# Patient Record
Sex: Female | Born: 1935 | Race: White | Hispanic: No | Smoking: Former smoker
Health system: Southern US, Community
[De-identification: ages and names within clinical notes are randomized; demographics above are authoritative.]

## PROBLEM LIST (undated history)

## (undated) DIAGNOSIS — C801 Malignant (primary) neoplasm, unspecified: Secondary | ICD-10-CM

## (undated) DIAGNOSIS — E079 Disorder of thyroid, unspecified: Secondary | ICD-10-CM

## (undated) HISTORY — PX: BREAST RECONSTRUCTION: SHX9

## (undated) HISTORY — PX: EYE SURGERY: SHX253

---

## 1998-02-23 ENCOUNTER — Ambulatory Visit (HOSPITAL_COMMUNITY): Admission: RE | Admit: 1998-02-23 | Discharge: 1998-02-23 | Payer: Self-pay | Admitting: Urology

## 1998-02-25 ENCOUNTER — Other Ambulatory Visit: Admission: RE | Admit: 1998-02-25 | Discharge: 1998-02-25 | Payer: Self-pay | Admitting: *Deleted

## 2009-08-11 ENCOUNTER — Emergency Department (HOSPITAL_COMMUNITY): Admission: EM | Admit: 2009-08-11 | Discharge: 2009-08-11 | Payer: Self-pay | Admitting: Emergency Medicine

## 2014-10-11 ENCOUNTER — Emergency Department (HOSPITAL_COMMUNITY): Payer: Medicare Other

## 2014-10-11 ENCOUNTER — Encounter (HOSPITAL_COMMUNITY): Payer: Self-pay | Admitting: *Deleted

## 2014-10-11 ENCOUNTER — Inpatient Hospital Stay (HOSPITAL_COMMUNITY)
Admission: EM | Admit: 2014-10-11 | Discharge: 2014-10-16 | DRG: 065 | Disposition: A | Discharge status: Skilled Nursing Facility | Payer: Medicare Other | Attending: Internal Medicine | Admitting: Internal Medicine

## 2014-10-11 DIAGNOSIS — G8194 Hemiplegia, unspecified affecting left nondominant side: Secondary | ICD-10-CM

## 2014-10-11 DIAGNOSIS — Z853 Personal history of malignant neoplasm of breast: Secondary | ICD-10-CM | POA: Diagnosis not present

## 2014-10-11 DIAGNOSIS — I63511 Cerebral infarction due to unspecified occlusion or stenosis of right middle cerebral artery: Secondary | ICD-10-CM

## 2014-10-11 DIAGNOSIS — Z66 Do not resuscitate: Secondary | ICD-10-CM | POA: Diagnosis present

## 2014-10-11 DIAGNOSIS — Z515 Encounter for palliative care: Secondary | ICD-10-CM | POA: Diagnosis not present

## 2014-10-11 DIAGNOSIS — Z87891 Personal history of nicotine dependence: Secondary | ICD-10-CM

## 2014-10-11 DIAGNOSIS — I6350 Cerebral infarction due to unspecified occlusion or stenosis of unspecified cerebral artery: Secondary | ICD-10-CM | POA: Diagnosis not present

## 2014-10-11 DIAGNOSIS — R4701 Aphasia: Secondary | ICD-10-CM | POA: Diagnosis present

## 2014-10-11 DIAGNOSIS — R4182 Altered mental status, unspecified: Secondary | ICD-10-CM | POA: Diagnosis not present

## 2014-10-11 DIAGNOSIS — G8190 Hemiplegia, unspecified affecting unspecified side: Secondary | ICD-10-CM | POA: Diagnosis not present

## 2014-10-11 DIAGNOSIS — R778 Other specified abnormalities of plasma proteins: Secondary | ICD-10-CM | POA: Diagnosis present

## 2014-10-11 DIAGNOSIS — R001 Bradycardia, unspecified: Secondary | ICD-10-CM | POA: Diagnosis present

## 2014-10-11 DIAGNOSIS — R7989 Other specified abnormal findings of blood chemistry: Secondary | ICD-10-CM | POA: Diagnosis not present

## 2014-10-11 DIAGNOSIS — I639 Cerebral infarction, unspecified: Secondary | ICD-10-CM | POA: Diagnosis not present

## 2014-10-11 DIAGNOSIS — I248 Other forms of acute ischemic heart disease: Secondary | ICD-10-CM | POA: Diagnosis present

## 2014-10-11 DIAGNOSIS — R1314 Dysphagia, pharyngoesophageal phase: Secondary | ICD-10-CM | POA: Diagnosis not present

## 2014-10-11 HISTORY — DX: Disorder of thyroid, unspecified: E07.9

## 2014-10-11 HISTORY — DX: Malignant (primary) neoplasm, unspecified: C80.1

## 2014-10-11 LAB — URINALYSIS, ROUTINE W REFLEX MICROSCOPIC
Bilirubin Urine: NEGATIVE
Glucose, UA: NEGATIVE mg/dL
Ketones, ur: 15 mg/dL — AB
Leukocytes, UA: NEGATIVE
Nitrite: NEGATIVE
Protein, ur: 30 mg/dL — AB
Specific Gravity, Urine: 1.025 (ref 1.005–1.030)
Urobilinogen, UA: 0.2 mg/dL (ref 0.0–1.0)
pH: 5.5 (ref 5.0–8.0)

## 2014-10-11 LAB — COMPREHENSIVE METABOLIC PANEL
ALT: 95 U/L — ABNORMAL HIGH (ref 14–54)
AST: 53 U/L — ABNORMAL HIGH (ref 15–41)
Albumin: 3.7 g/dL (ref 3.5–5.0)
Alkaline Phosphatase: 56 U/L (ref 38–126)
Anion gap: 12 (ref 5–15)
BUN: 43 mg/dL — ABNORMAL HIGH (ref 6–20)
CO2: 23 mmol/L (ref 22–32)
Calcium: 8.8 mg/dL — ABNORMAL LOW (ref 8.9–10.3)
Chloride: 98 mmol/L — ABNORMAL LOW (ref 101–111)
Creatinine, Ser: 0.98 mg/dL (ref 0.44–1.00)
GFR calc Af Amer: 57 mL/min — ABNORMAL LOW (ref >60)
GFR calc non Af Amer: 49 mL/min — ABNORMAL LOW (ref >60)
Glucose, Bld: 140 mg/dL — ABNORMAL HIGH (ref 65–99)
Potassium: 3.8 mmol/L (ref 3.5–5.1)
Sodium: 133 mmol/L — ABNORMAL LOW (ref 135–145)
Total Bilirubin: 1.5 mg/dL — ABNORMAL HIGH (ref 0.3–1.2)
Total Protein: 6.9 g/dL (ref 6.5–8.1)

## 2014-10-11 LAB — RAPID URINE DRUG SCREEN, HOSP PERFORMED
Amphetamines: NOT DETECTED
Amphetamines: NOT DETECTED
Barbiturates: NOT DETECTED
Barbiturates: NOT DETECTED
Benzodiazepines: NOT DETECTED
Benzodiazepines: NOT DETECTED
COCAINE: NOT DETECTED
Cocaine: NOT DETECTED
Opiates: NOT DETECTED
Opiates: NOT DETECTED
Tetrahydrocannabinol: NOT DETECTED
Tetrahydrocannabinol: NOT DETECTED

## 2014-10-11 LAB — GLUCOSE, CAPILLARY
GLUCOSE-CAPILLARY: 109 mg/dL — AB (ref 65–99)
GLUCOSE-CAPILLARY: 103 mg/dL — AB (ref 65–99)
Glucose-Capillary: 116 mg/dL — ABNORMAL HIGH (ref 65–99)
Glucose-Capillary: 94 mg/dL (ref 65–99)

## 2014-10-11 LAB — PROTIME-INR
INR: 1.27 (ref 0.00–1.49)
PROTHROMBIN TIME: 16 s — AB (ref 11.6–15.2)

## 2014-10-11 LAB — CBC
HEMATOCRIT: 37.5 % (ref 36.0–46.0)
Hemoglobin: 12.5 g/dL (ref 12.0–15.0)
MCH: 31.4 pg (ref 26.0–34.0)
MCHC: 33.3 g/dL (ref 30.0–36.0)
MCV: 94.2 fL (ref 78.0–100.0)
PLATELETS: 208 10*3/uL (ref 150–400)
RBC: 3.98 MIL/uL (ref 3.87–5.11)
RDW: 13.6 % (ref 11.5–15.5)
WBC: 11.1 10*3/uL — ABNORMAL HIGH (ref 4.0–10.5)

## 2014-10-11 LAB — DIFFERENTIAL
BASOS PCT: 0 % (ref 0–1)
Basophils Absolute: 0 10*3/uL (ref 0.0–0.1)
Eosinophils Absolute: 0 10*3/uL (ref 0.0–0.7)
Eosinophils Relative: 0 % (ref 0–5)
LYMPHS PCT: 3 % — AB (ref 12–46)
Lymphs Abs: 0.3 10*3/uL — ABNORMAL LOW (ref 0.7–4.0)
Monocytes Absolute: 0.9 10*3/uL (ref 0.1–1.0)
Monocytes Relative: 8 % (ref 3–12)
NEUTROS PCT: 89 % — AB (ref 43–77)
Neutro Abs: 9.9 10*3/uL — ABNORMAL HIGH (ref 1.7–7.7)

## 2014-10-11 LAB — CK: Total CK: 541 U/L — ABNORMAL HIGH (ref 38–234)

## 2014-10-11 LAB — URINE MICROSCOPIC-ADD ON

## 2014-10-11 LAB — I-STAT TROPONIN, ED: Troponin i, poc: 1.95 ng/mL (ref 0.00–0.08)

## 2014-10-11 LAB — APTT: APTT: 27 s (ref 24–37)

## 2014-10-11 MED ORDER — STROKE: EARLY STAGES OF RECOVERY BOOK
Freq: Once | Status: AC
  Administered 2014-10-11: 08:00:00
  Filled 2014-10-11: qty 1

## 2014-10-11 MED ORDER — CETYLPYRIDINIUM CHLORIDE 0.05 % MT LIQD
7.0000 mL | Freq: Two times a day (BID) | OROMUCOSAL | Status: DC
  Administered 2014-10-11 – 2014-10-15 (×8): 7 mL via OROMUCOSAL

## 2014-10-11 MED ORDER — SODIUM CHLORIDE 0.9 % IV SOLN
INTRAVENOUS | Status: AC
  Administered 2014-10-11 – 2014-10-12 (×2): via INTRAVENOUS

## 2014-10-11 MED ORDER — CHLORHEXIDINE GLUCONATE 0.12 % MT SOLN
15.0000 mL | Freq: Two times a day (BID) | OROMUCOSAL | Status: DC
  Administered 2014-10-11 – 2014-10-14 (×7): 15 mL via OROMUCOSAL
  Filled 2014-10-11 (×6): qty 15

## 2014-10-11 MED ORDER — ASPIRIN 300 MG RE SUPP
300.0000 mg | Freq: Every day | RECTAL | Status: DC
  Administered 2014-10-11 – 2014-10-15 (×5): 300 mg via RECTAL
  Filled 2014-10-11 (×7): qty 1

## 2014-10-11 MED ORDER — HEPARIN SODIUM (PORCINE) 5000 UNIT/ML IJ SOLN
5000.0000 [IU] | Freq: Three times a day (TID) | INTRAMUSCULAR | Status: DC
  Administered 2014-10-11 – 2014-10-16 (×13): 5000 [IU] via SUBCUTANEOUS
  Filled 2014-10-11 (×15): qty 1

## 2014-10-11 NOTE — H&P (Signed)
[facility] History and Physical  Tara Welch TKP:[id number] DOB: March 30, 1922    PCP:   No primary care provider on file.   Chief Complaint: altered mental status and left sided weakness.   HPI: Tara Welch is an 81 y.o. female lives at home with her son, brought to the ER via EMS as a code stroke.  His son noted she was "out of it" over 12 hours prior to arrival (Noon time on Sun May 30th, 2016).  He subsequently found her on the floor and summoned EMS.  In the ER, Code stroke was cancelled as her ictus was over 12 hours PTA.  She had an urgent CT of the head, which showed a large subacute CVA in the MCA territory.  Her troponin was also elevated to 1.95.  Her EKG showed accelerated junctional rhythm with no ST elevation.  She was able to converse, but was not able to move her left side.  She was not able to swallow.  Hospitalist was asked to admit her for acute/subacute severe CVA.  When I saw her, her son had left the hospital.   Rewiew of Systems: Unable.   Past Medical History  Diagnosis Date  . Cancer     right breast  . Thyroid disease     Past Surgical History  Procedure Laterality Date  . Breast reconstruction Right   . Eye surgery      Medications:  HOME MEDS: Prior to Admission medications   Not on File     Allergies:  No Known Allergies  Social History:   reports that she has quit smoking. She does not have any smokeless tobacco history on file. She reports that she does not drink alcohol or use illicit drugs.  Family History: History reviewed. No pertinent family history.   Physical Exam: Filed Vitals:   10/11/14 0326 10/11/14 0330 10/11/14 0332 10/11/14 0430  BP: 94/82 94/82 115/62 100/54  Pulse: 58 57 56 52  Resp: 20 16 18 18   SpO2: 100% 100% 100% 100%   Blood pressure 100/54, pulse 52, resp. rate 18, SpO2 100 %.  GEN:  Pleasant patient lying in the stretcher in no acute distress; cooperative with exam. Speech is slightly slurred.  PSYCH:   alert and oriented x4 HEENT: Mucous membranes pink and anicteric; PERRLA; She has an extreme right gaze,  no cervical lymphadenopathy nor thyromegaly or carotid bruit; no JVD; There were no stridor. Neck is very supple. Breasts:: Not examined CHEST WALL: No tenderness CHEST: Normal respiration, clear to auscultation bilaterally.  HEART: Regular rhythm.  There are no murmur, rub, or gallops.   BACK: No kyphosis or scoliosis; no CVA tenderness ABDOMEN: soft and non-tender; no masses, no organomegaly, normal abdominal bowel sounds; no pannus; no intertriginous candida. There is no rebound and no distention. Rectal Exam: Not done EXTREMITIES: No bone or joint deformity; age-appropriate arthropathy of the hands and knees; no edema; no ulcerations.  There is no calf tenderness. Genitalia: not examined PULSES: 2+ and symmetric SKIN: Normal hydration no rash or ulceration CNS: She has dense paralysis of the left side, barbinski is positive, and her eyes deviated to the right.  She was able to move her right side well.    Labs on Admission:  Basic Metabolic Panel:  Recent Labs Lab 10/11/14 0300  NA 133*  K 3.8  CL 98*  CO2 23  GLUCOSE 140*  BUN 43*  CREATININE 0.98  CALCIUM 8.8*   Liver Function Tests:  Recent  Labs Lab 10/11/14 0300  AST 53*  ALT 95*  ALKPHOS 56  BILITOT 1.5*  PROT 6.9  ALBUMIN 3.7   No results for input(s): LIPASE, AMYLASE in the last 168 hours. No results for input(s): AMMONIA in the last 168 hours. CBC:  Recent Labs Lab 10/11/14 0300  WBC 11.1*  NEUTROABS 9.9*  HGB 12.5  HCT 37.5  MCV 94.2  PLT 208   Cardiac Enzymes: No results for input(s): CKTOTAL, CKMB, CKMBINDEX, TROPONINI in the last 168 hours.  CBG: No results for input(s): GLUCAP in the last 168 hours.   Radiological Exams on Admission: Ct Head Wo Contrast  10/11/2014   CLINICAL DATA:  Stroke symptoms. Last known normal at lunch yesterday.  EXAM: CT HEAD WITHOUT CONTRAST  TECHNIQUE:  Contiguous axial images were obtained from the base of the skull through the vertex without intravenous contrast.  COMPARISON:  None.  FINDINGS: There is rather extensive hypodensity involving right temporal, frontal, and parietal lobes in the MCA distribution consistent with subacute infarct. There is likely some degree of surrounding edema. No definite associated midline shift. There is no intracranial hemorrhage. Periventricular white matter hypodensity is nonspecific, likely chronic small vessel ischemia. No intracranial or subdural collection. Age related atrophy. Atherosclerosis of the intracranial vasculature at the skull base. The calvarium is intact. There is mucosal thickening of the ethmoid air cells. The mastoid air cells are well aerated.  IMPRESSION: 1. Findings consistent with large subacute infarct in the right MCA distribution. Likely some degree of surrounding edema but no midline shift. 2. Background atrophy and chronic small vessel ischemic change. These results were called by telephone at the time of interpretation on 10/11/2014 at 3:30 am to Dr. Shanon Rosser , who verbally acknowledged these results.   Electronically Signed   By: Jeb Levering M.D.   On: 10/11/2014 03:42    EKG: Independently reviewed.    Assessment/Plan Present on Admission:  . Acute CVA (cerebrovascular accident)  PLAN:   She will be admitted for an large acute/subacute right MCA territory.  She has dense hemiplegia, dysphagia, and slight aphasia as well.  She has some edema seen in the CT scan.  I called her son to let him know that she had a large CVA, and likely will get worse with more edema in 1-2 days. She may not survice this stroke.  I have not ordered MRI, echo, or carotid, as I am unsure how she will be clinically.  We discussed code status, and he agreed that she should be a DNR given poor quality of life if she survive this stroke.   Will consult neurology for further recommendation.  She will be given  rectal ASA 300mg  per day, and will be made NPO.  There is no indication for hypertonic saline, steroids, mannitol, or hyperventilation for her cerebral edema at this time.  Will admit her to telemetry.  Thank you for allowing me to participate in her care.   Other plans as per orders.  Code Status: DNR   Orvan Falconer, MD. [facility] Pager [phone] 7pm to 7am.  10/11/2014, 5:13 AM

## 2014-10-11 NOTE — ED Notes (Signed)
Pt brought in by [facility] for c/o stroke symptoms; pt was found on floor by family friend; family friend states pt was last seen normal at lunch time yesterday; pt is alert and oriented only to self

## 2014-10-11 NOTE — ED Provider Notes (Signed)
CSN: [id number]     Arrival date & time 10/11/14  0251 History   First MD Initiated Contact with Patient 10/11/14 0305     Chief Complaint  Patient presents with  . Stroke Symptoms     (Consider location/radiation/quality/duration/timing/severity/associated sxs/prior Treatment) HPI  Level 5 Caveat: confusion. This is a 81 year old female who was last seen normal yesterday morning. Her son reports that she had some difficulty speaking around lunchtime yesterday and she did not eat any lunch. She went to bed and spent the rest of the day in bed. He noticed that she had difficulty moving yesterday afternoon about 5 PM but let her sleep. He came home about 2 AM today and found her on the floor in her bedroom. She had not been incontinent. She was awake but unable to move her left side. EMS reports rightward gaze and left hemiplegia. She denies pain.  Past Medical History  Diagnosis Date  . Cancer     right breast  . Thyroid disease    Past Surgical History  Procedure Laterality Date  . Breast reconstruction Right   . Eye surgery     History reviewed. No pertinent family history. History  Substance Use Topics  . Smoking status: Former Research scientist (life sciences)  . Smokeless tobacco: Not on file  . Alcohol Use: No   OB History    No data available     Review of Systems  Unable to perform ROS   Allergies  Review of patient's allergies indicates no known allergies.  Home Medications   Prior to Admission medications   Not on File   BP 123/57 mmHg  Pulse 55  Temp(Src) 97.4 F (36.3 C) (Rectal)  Resp 21  SpO2 100%   Physical Exam  General: Well-developed, well-nourished female in no acute distress; appearance consistent with age of record HENT: normocephalic; atraumatic Eyes: pupils equal, round and reactive to light; nystagmus present; rightward gaze, will not move gaze to left of midline Neck: supple; no bruit heard Heart: regular rate and rhythm; no murmur Lungs: clear to  auscultation bilaterally Abdomen: soft; nondistended; nontender; bowel sounds present Extremities: Arthritic changes Neurologic: Awake, alert, disoriented; dense left hemiplegia; left facial droop  Skin: Warm and dry    ED Course  Procedures (including critical care time)  CRITICAL CARE Performed by: Ahmani Daoud L Total critical care time: 30 minutes Critical care time was exclusive of separately billable procedures and treating other patients. Critical care was necessary to treat or prevent imminent or life-threatening deterioration. Critical care was time spent personally by me on the following activities: development of treatment plan with patient and/or surrogate as well as nursing, discussions with consultants, evaluation of patient's response to treatment, examination of patient, obtaining history from patient or surrogate, ordering and performing treatments and interventions, ordering and review of laboratory studies, ordering and review of radiographic studies, pulse oximetry and re-evaluation of patient's condition.   MDM  Nursing notes and vitals signs, including pulse oximetry, reviewed.  Summary of this visit's results, reviewed by myself:   EKG Interpretation  Date/Time:  Monday Oct 11 2014 03:01:07 EDT Ventricular Rate:  64 PR Interval:  140 QRS Duration: 81 QT Interval:  525 QTC Calculation: 542 R Axis:   38 Text Interpretation:  Junctional rhythm Low voltage, extremity leads Borderline repolarization abnormality Minimal ST elevation, inferior leads Prolonged QT interval No previous ECGs available Confirmed by Florina Ou  MD, Jenny Reichmann ([id number]) on 10/11/2014 3:55:11 AM       Labs:  Results for orders  placed or performed during the hospital encounter of 10/11/14 (from the past 24 hour(s))  Protime-INR     Status: Abnormal   Collection Time: 10/11/14  3:00 AM  Result Value Ref Range   Prothrombin Time 16.0 (H) 11.6 - 15.2 seconds   INR 1.27 0.00 - 1.49  APTT     Status:  None   Collection Time: 10/11/14  3:00 AM  Result Value Ref Range   aPTT 27 24 - 37 seconds  CBC     Status: Abnormal   Collection Time: 10/11/14  3:00 AM  Result Value Ref Range   WBC 11.1 (H) 4.0 - 10.5 K/uL   RBC 3.98 3.87 - 5.11 MIL/uL   Hemoglobin 12.5 12.0 - 15.0 g/dL   HCT 37.5 36.0 - 46.0 %   MCV 94.2 78.0 - 100.0 fL   MCH 31.4 26.0 - 34.0 pg   MCHC 33.3 30.0 - 36.0 g/dL   RDW 13.6 11.5 - 15.5 %   Platelets 208 150 - 400 K/uL  Differential     Status: Abnormal   Collection Time: 10/11/14  3:00 AM  Result Value Ref Range   Neutrophils Relative % 89 (H) 43 - 77 %   Neutro Abs 9.9 (H) 1.7 - 7.7 K/uL   Lymphocytes Relative 3 (L) 12 - 46 %   Lymphs Abs 0.3 (L) 0.7 - 4.0 K/uL   Monocytes Relative 8 3 - 12 %   Monocytes Absolute 0.9 0.1 - 1.0 K/uL   Eosinophils Relative 0 0 - 5 %   Eosinophils Absolute 0.0 0.0 - 0.7 K/uL   Basophils Relative 0 0 - 1 %   Basophils Absolute 0.0 0.0 - 0.1 K/uL  Comprehensive metabolic panel     Status: Abnormal   Collection Time: 10/11/14  3:00 AM  Result Value Ref Range   Sodium 133 (L) 135 - 145 mmol/L   Potassium 3.8 3.5 - 5.1 mmol/L   Chloride 98 (L) 101 - 111 mmol/L   CO2 23 22 - 32 mmol/L   Glucose, Bld 140 (H) 65 - 99 mg/dL   BUN 43 (H) 6 - 20 mg/dL   Creatinine, Ser 0.98 0.44 - 1.00 mg/dL   Calcium 8.8 (L) 8.9 - 10.3 mg/dL   Total Protein 6.9 6.5 - 8.1 g/dL   Albumin 3.7 3.5 - 5.0 g/dL   AST 53 (H) 15 - 41 U/L   ALT 95 (H) 14 - 54 U/L   Alkaline Phosphatase 56 38 - 126 U/L   Total Bilirubin 1.5 (H) 0.3 - 1.2 mg/dL   GFR calc non Af Amer 49 (L) >60 mL/min   GFR calc Af Amer 57 (L) >60 mL/min   Anion gap 12 5 - 15  CK     Status: Abnormal   Collection Time: 10/11/14  3:00 AM  Result Value Ref Range   Total CK 541 (H) 38 - 234 U/L  I-stat troponin, ED (not at [facility])     Status: Abnormal   Collection Time: 10/11/14  4:07 AM  Result Value Ref Range   Troponin i, poc 1.95 (HH) 0.00 - 0.08 ng/mL   Comment 3          Urine  Drug Screen     Status: None   Collection Time: 10/11/14  5:50 AM  Result Value Ref Range   Opiates NONE DETECTED NONE DETECTED   Cocaine NONE DETECTED NONE DETECTED   Benzodiazepines NONE DETECTED NONE DETECTED   Amphetamines NONE DETECTED  NONE DETECTED   Tetrahydrocannabinol NONE DETECTED NONE DETECTED   Barbiturates NONE DETECTED NONE DETECTED  Urinalysis, Routine w reflex microscopic     Status: Abnormal   Collection Time: 10/11/14  5:50 AM  Result Value Ref Range   Color, Urine YELLOW YELLOW   APPearance HAZY (A) CLEAR   Specific Gravity, Urine 1.025 1.005 - 1.030   pH 5.5 5.0 - 8.0   Glucose, UA NEGATIVE NEGATIVE mg/dL   Hgb urine dipstick MODERATE (A) NEGATIVE   Bilirubin Urine NEGATIVE NEGATIVE   Ketones, ur 15 (A) NEGATIVE mg/dL   Protein, ur 30 (A) NEGATIVE mg/dL   Urobilinogen, UA 0.2 0.0 - 1.0 mg/dL   Nitrite NEGATIVE NEGATIVE   Leukocytes, UA NEGATIVE NEGATIVE  Urine microscopic-add on     Status: Abnormal   Collection Time: 10/11/14  5:50 AM  Result Value Ref Range   WBC, UA 3-6 <3 WBC/hpf   RBC / HPF 7-10 <3 RBC/hpf   Bacteria, UA MANY (A) RARE    Imaging Studies: Ct Head Wo Contrast  10/11/2014   CLINICAL DATA:  Stroke symptoms. Last known normal at lunch yesterday.  EXAM: CT HEAD WITHOUT CONTRAST  TECHNIQUE: Contiguous axial images were obtained from the base of the skull through the vertex without intravenous contrast.  COMPARISON:  None.  FINDINGS: There is rather extensive hypodensity involving right temporal, frontal, and parietal lobes in the MCA distribution consistent with subacute infarct. There is likely some degree of surrounding edema. No definite associated midline shift. There is no intracranial hemorrhage. Periventricular white matter hypodensity is nonspecific, likely chronic small vessel ischemia. No intracranial or subdural collection. Age related atrophy. Atherosclerosis of the intracranial vasculature at the skull base. The calvarium is intact.  There is mucosal thickening of the ethmoid air cells. The mastoid air cells are well aerated.  IMPRESSION: 1. Findings consistent with large subacute infarct in the right MCA distribution. Likely some degree of surrounding edema but no midline shift. 2. Background atrophy and chronic small vessel ischemic change. These results were called by telephone at the time of interpretation on 10/11/2014 at 3:30 am to Dr. Shanon Rosser , who verbally acknowledged these results.   Electronically Signed   By: Jeb Levering M.D.   On: 10/11/2014 03:42      Shanon Rosser, MD 10/11/14 [phone]

## 2014-10-11 NOTE — Progress Notes (Signed)
Patient noted very sleepy, hard to arouse so far this shift. Unable to complete admission assessment at this time d/t patient condition. Awaiting patient's son's arrival to assist with admission assessment/questions.

## 2014-10-11 NOTE — Progress Notes (Signed)
Unable to obtain admission weight this shift d/t patient's condition and unable to stand.

## 2014-10-11 NOTE — Progress Notes (Signed)
0302-Call from ER to state CODE STROKE on way to CT. NO PAGE on beeper received. 0303-Attempted to add patient exam but was not able due to [place] placed on the CT schedule.  [facility] was called at [place]. 0307-Patient arrived with nurse [phone]- Scan done under EMERGENCY PATIENT 0314-EDITED all information able to obtain 0325-Spoke with Dr Marisue Humble regarding the case and the exception situation 0329-EPIC called to notify that block had been lifted from schedule. 0330-Exception resolved and spoke to Dr. Marisue Humble again to let her know images and exam information resolved

## 2014-10-11 NOTE — Progress Notes (Signed)
1756 Patient continues to be NPO with no IV fluid orders at this time. Oral care given. MD notified.

## 2014-10-11 NOTE — Progress Notes (Signed)
Patient seen and examined, dose not seem in distress, but not following commend, no open eyes, not moving left side. +cardiac murmur, clear lungs. Keep npo, neurology consult pending, prognosis guarded, DNR.

## 2014-10-12 LAB — LIPID PANEL
CHOL/HDL RATIO: 3.6 ratio
Cholesterol: 120 mg/dL (ref 0–200)
HDL: 33 mg/dL — AB (ref >40)
LDL CALC: 71 mg/dL (ref 0–99)
TRIGLYCERIDES: 78 mg/dL (ref <150)
VLDL: 16 mg/dL (ref 0–40)

## 2014-10-12 LAB — GLUCOSE, CAPILLARY
Glucose-Capillary: 95 mg/dL (ref 65–99)
Glucose-Capillary: 86 mg/dL (ref 65–99)
Glucose-Capillary: 77 mg/dL (ref 65–99)
Glucose-Capillary: 88 mg/dL (ref 65–99)

## 2014-10-12 LAB — HEMOGLOBIN A1C
HEMOGLOBIN A1C: 6 % — AB (ref 4.8–5.6)
Mean Plasma Glucose: 126 mg/dL

## 2014-10-12 LAB — TROPONIN I: Troponin I: 0.95 ng/mL (ref <0.031)

## 2014-10-12 LAB — MRSA PCR SCREENING: MRSA by PCR: NEGATIVE

## 2014-10-12 MED ORDER — DEXTROSE-NACL 5-0.45 % IV SOLN
INTRAVENOUS | Status: DC
  Administered 2014-10-12 – 2014-10-13 (×2): via INTRAVENOUS

## 2014-10-12 NOTE — Progress Notes (Signed)
Patient's heart rate consistently dropping into the 30s on telemetry monitoring throughout the last hour. Dr. Darrick Meigs notified.

## 2014-10-12 NOTE — Progress Notes (Signed)
Report given to [place], Therapist, sports. Patient transferred safely to ICU Rm 10. Son at bedside.

## 2014-10-12 NOTE — Care Management Note (Signed)
Case Management Note  Patient Details  Name: Tara Welch MRN: [id number] Date of Birth: [birth year]  Subjective/Objective:                  Pt admitted from home with CVA. Pt lives with her son and will return home at discharge. Pt had been independent with ADL's.   Action/Plan: Will need PT consult to assist with determining disposition. Pt is very lethargic today.  Expected Discharge Date:                  Expected Discharge Plan:  [place]  In-House Referral:  NA  Discharge planning Services  CM Consult  Post Acute Care Choice:    Choice offered to:     DME Arranged:    DME Agency:     HH Arranged:    HH Agency:     Status of Service:  In process, will continue to follow  Medicare Important Message Given:    Date Medicare IM Given:    Medicare IM give by:    Date Additional Medicare IM Given:    Additional Medicare Important Message give by:     If discussed at [place] of Stay Meetings, dates discussed:    Additional Comments:  Joylene Draft, RN 10/12/2014, 2:10 PM

## 2014-10-12 NOTE — Progress Notes (Signed)
Consistently irregular cardiac rhythm noted upon reviewing telemetry monitor. Patient continues to be bradycardic as well. VS otherwise stable. Patient denies chest pain or shortness of breath and is otherwise asymptomatic. Attending MD notified as this change in rhythm is new for patient. Will continue to monitor patient.

## 2014-10-12 NOTE — Evaluation (Signed)
Clinical/Bedside Swallow Evaluation Patient Details  Name: Tara Welch MRN: [id number] Date of Birth: 08/22/1921  Today's Date: 10/12/2014 Time: SLP Start Time (ACUTE ONLY): 1643 SLP Stop Time (ACUTE ONLY): 1710 SLP Time Calculation (min) (ACUTE ONLY): 27 min  Past Medical History:  Past Medical History  Diagnosis Date  . Cancer     right breast  . Thyroid disease    Past Surgical History:  Past Surgical History  Procedure Laterality Date  . Breast reconstruction Right   . Eye surgery     HPI:  Ms. Tara Welch was brought to the hospital with AMS and left hemiplegia, from subacute acute right MCA territory infarct,admitting physician Dr. Truman Hayward had called patient's son and explained that patient could get worse in 1-2 days. She was made DO NOT RESUSCITATE. Neurology was consulted, consult is still pending. Neurology to see tonight. No further workup including MRI echo or carotid was ordered by the admitting physician. Will await neurology recommendations regarding further workup. Continue aspirin. Patient has mild elevation of troponin 1.95, no chest pain but has multiple PVCs on telemetry. Pt failed RN swallow screen and clinical swallow evaluation ordered as part of stroke protocol. Pt lives at home with her son, but was reportedly very active PTA.   Assessment / Plan / Recommendation Clinical Impression  Ms. Tara Welch was pleasant, polite, and cooperative throughout the evaluation. She denies any changes in speech/swallowing, however awareness is poor. She presents with left facial weakness, tongue deviation to left, and decreased buccal strength. She is able to follow simple directions, but needs tactile cues at times. Pt with moderate oral phase dysphagia and suspected pharyngeal phase characterized by poor labial seal, decreased bolus manipulation and posterior propulsion, pocketing on left side, suspected delay in swallow initiation with strong cough response and wet vocal quality. Recommend  continue NPO with temporary nutrition and SLP to re-evaluate tomorrow and complete MBSS if indicated. Continue oral care and pt ok for single ice chips when presented by RN for comfort after oral care. Recommendations and risks for aspiration were reviewed with pt, however she again requested a glass of water prior to my departure. SLP will follow for dysphagia and cognitive linguistic evaluation.     Aspiration Risk  Moderate    Diet Recommendation NPO;Ice chips PRN after oral care   Medication Administration: Via alternative means    Other  Recommendations Oral Care Recommendations: Oral care QID   Follow Up Recommendations       Frequency and Duration min 2x/week  1 week   Pertinent Vitals/Pain VSS    SLP Swallow Goals  Reassess tomorrow for po readiness   Swallow Study Prior Functional Status   Lives with son, independent    General Date of Onset: 10/11/14 Other Pertinent Information: Ms. Tara Welch was brought to the hospital with AMS and left hemiplegia, from subacute acute right MCA territory infarct,admitting physician Dr. Truman Hayward had called patient's son and explained that patient could get worse in 1-2 days. She was made DO NOT RESUSCITATE. Neurology was consulted, consult is still pending. Neurology to see tonight. No further workup including MRI echo or carotid was ordered by the admitting physician. Will await neurology recommendations regarding further workup. Continue aspirin. Patient has mild elevation of troponin 1.95, no chest pain but has multiple PVCs on telemetry. Pt failed RN swallow screen and clinical swallow evaluation ordered as part of stroke protocol. Pt lives at home with her son, but was reportedly very active PTA. Type of Study: Bedside  swallow evaluation Diet Prior to this Study: NPO Temperature Spikes Noted: No Respiratory Status: Room air History of Recent Intubation: No Behavior/Cognition: Alert;Cooperative;Pleasant mood;Requires cueing Oral Cavity -  Dentition: Adequate natural dentition/normal for age Self-Feeding Abilities: Able to feed self;Needs set up (suspect left neglect) Patient Positioning: Upright in bed Baseline Vocal Quality: Normal Volitional Cough: Strong Volitional Swallow: Able to elicit (with delay)    Oral/Motor/Sensory Function Overall Oral Motor/Sensory Function: Impaired Labial ROM: Reduced left Labial Symmetry: Abnormal symmetry left Labial Strength: Reduced Labial Sensation: Reduced Lingual ROM: Reduced left Lingual Symmetry: Abnormal symmetry left Lingual Strength: Reduced Lingual Sensation: Reduced Facial ROM: Reduced left Facial Symmetry: Left droop Facial Strength: Reduced Facial Sensation: Reduced Velum:  (appears mildly reduced) Mandible: Within Functional Limits   Ice Chips Ice chips: Impaired Presentation: Spoon Oral Phase Impairments: Reduced lingual movement/coordination;Poor awareness of bolus Oral Phase Functional Implications: Left anterior spillage;Oral holding;Prolonged oral transit Pharyngeal Phase Impairments: Suspected delayed Swallow;Decreased hyoid-laryngeal movement   Thin Liquid Thin Liquid: Impaired Presentation: Cup;Spoon;Self Fed Oral Phase Impairments: Reduced labial seal;Reduced lingual movement/coordination Oral Phase Functional Implications: Left anterior spillage;Prolonged oral transit Pharyngeal  Phase Impairments: Suspected delayed Swallow;Decreased hyoid-laryngeal movement;Cough - Immediate    Nectar Thick Nectar Thick Liquid: Impaired Presentation: Spoon Oral Phase Impairments: Reduced labial seal;Reduced lingual movement/coordination;Impaired anterior to posterior transit;Poor awareness of bolus Oral phase functional implications: Left anterior spillage;Left lateral sulci pocketing;Prolonged oral transit;Oral residue;Oral holding Pharyngeal Phase Impairments: Suspected delayed Swallow;Decreased hyoid-laryngeal movement   Honey Thick Honey Thick Liquid: Not tested    Puree Puree: Impaired Presentation: Spoon Oral Phase Impairments: Reduced lingual movement/coordination;Impaired anterior to posterior transit Oral Phase Functional Implications: Left anterior spillage;Prolonged oral transit;Left lateral sulci pocketing;Oral residue;Oral holding Pharyngeal Phase Impairments: Suspected delayed Swallow;Decreased hyoid-laryngeal movement;Throat Clearing - Immediate   Solid   Thank you,  Genene Churn, CCC-SLP [phone]     Solid: Not tested       Travone Georg 10/12/2014,5:27 PM

## 2014-10-12 NOTE — Progress Notes (Signed)
Called by nursing staff that patient has persistently irregular rhythm on telemetry. EKG obtained which shows undetermined rhythm with a rate of 120. Leads 1 aVL V5 V6 shows ST depression which is new change when compared to previous EKG from yesterday. Patient denies any pain, she has been seen by speech therapist and recommendation is to keep her nothing by mouth with ice chips. We will move the patient to stepdown unit for cardiac monitoring. Patient also had mild elevation of troponin yesterday, second set of troponin has been ordered and is pending at this time. Regarding stroke I called and discussed with neurologist Dr. Marcie Mowers, who will see the patient tonight and make further recommendations.

## 2014-10-12 NOTE — Consult Note (Addendum)
Tara A. Merlene Laughter, MD     [email or web address]          Tara Welch is an 81 y.o. female.   ASSESSMENT/PLAN: 1. 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 Risk factor age. The patient does have early cytotoxic edema but I do not think that she is going to have significant swelling the next few days. I suspect that she will likely servive the stroke, but secondary complication are concerning. The most significant concern of aspiration pneumonia and respiratory problems given the severe dysphagia. Cardiac issues are also concerned especially since she's had some dysrhythmias already. Pulmonary embolisms also the concern.  Recommendation: Agree with antiplatelet agent aspirin. Carotid duplex Doppler. Feeding will have to be addressed definitively.  The patient is a 81 year old white female who was found to be unresponsive and acting much differently than she typically does. She was sent to the hospital for evaluation. On evaluation she does not have a left hemiplegia. Patient believes that she does have left shoulder and neck pain. Otherwise, Patient does not have any complaints at this time. The review of systems is otherwise unremarkable limited given the name. Cognition from patient's stroke.  GENERAL: She is in no acute distress.  HEENT: Supple. Atraumatic normocephalic.   ABDOMEN: soft  EXTREMITIES: No edema   BACK: Normal.  SKIN: Normal by inspection.    MENTAL STATUS: She is awake and alert. She knows the season the hospital at [facility]. She states the year is 2016 and the month as May. Her age is stated as 38. There is moderate dysarthria. She follows commands well especially on the right side. There is dense visual and hemibody neglect on the left side.  CRANIAL NERVES: Pupils are equal, round and reactive to light and accommodation; extra ocular movements are full, there is no significant nystagmus; visual fields shows a dense  left homonymous hemianopia; upper and lower facial muscles are normal in strength and symmetric, there is mild flattening of the nasolabial fold-L; tongue is midline;   MOTOR: Normal tone, bulk and strength; no pronator drift-R. Left hemiplegia.  COORDINATION: No dysmetria is no tremors.  REFLEXES: Deep tendon reflexes are symmetrical and normal. Babinski reflexes are flexor bilaterally.   SENSATION: Reduced sensation to light touch and temperature left side.     The patient had CT scan is reviewed in person. There is a large hypodensity involving the right temporal frontal and parietal region essentially approximating the MCA distribution on the right side. It is associated with cytotoxic edema and possibly slight midline shift. There is goal atrophy. There is also moderate periventricular leukoencephalopathy consistent with microscopic chronic ischemic changes. No hemorrhages noted.  Blood pressure 138/75, pulse 121, temperature 97.6 F (36.4 C), temperature source Oral, resp. rate 18, height 5' 8"  (1.727 m), weight 55.021 kg (121 lb 4.8 oz), SpO2 97 %.  Past Medical History  Diagnosis Date  . Cancer     right breast  . Thyroid disease     Past Surgical History  Procedure Laterality Date  . Breast reconstruction Right   . Eye surgery      History reviewed. No pertinent family history.  Social History:  reports that she has quit smoking. She does not have any smokeless tobacco history on file. She reports that she does not drink alcohol or use illicit drugs.  Allergies: No Known Allergies  Medications: Prior to Admission medications   Medication Sig Start Date End Date Taking? Authorizing Provider  metoprolol succinate (  TOPROL-XL) 50 MG 24 hr tablet Take 1 tablet by mouth daily. 09/21/14  Yes Historical Provider, MD  SYNTHROID 50 MCG tablet Take 1 tablet by mouth daily. 09/21/14  Yes Historical Provider, MD    Scheduled Meds: . antiseptic oral rinse  7 mL Mouth Rinse  q12n4p  . aspirin  300 mg Rectal Daily  . chlorhexidine  15 mL Mouth Rinse BID  . heparin  5,000 Units Subcutaneous 3 times per day   Continuous Infusions:  PRN Meds:.     Results for orders placed or performed during the hospital encounter of 10/11/14 (from the past 48 hour(s))  Protime-INR     Status: Abnormal   Collection Time: 10/11/14  3:00 AM  Result Value Ref Range   Prothrombin Time 16.0 (H) 11.6 - 15.2 seconds   INR 1.27 0.00 - 1.49  APTT     Status: None   Collection Time: 10/11/14  3:00 AM  Result Value Ref Range   aPTT 27 24 - 37 seconds  CBC     Status: Abnormal   Collection Time: 10/11/14  3:00 AM  Result Value Ref Range   WBC 11.1 (H) 4.0 - 10.5 K/uL   RBC 3.98 3.87 - 5.11 MIL/uL   Hemoglobin 12.5 12.0 - 15.0 g/dL   HCT 37.5 36.0 - 46.0 %   MCV 94.2 78.0 - 100.0 fL   MCH 31.4 26.0 - 34.0 pg   MCHC 33.3 30.0 - 36.0 g/dL   RDW 13.6 11.5 - 15.5 %   Platelets 208 150 - 400 K/uL  Differential     Status: Abnormal   Collection Time: 10/11/14  3:00 AM  Result Value Ref Range   Neutrophils Relative % 89 (H) 43 - 77 %   Neutro Abs 9.9 (H) 1.7 - 7.7 K/uL   Lymphocytes Relative 3 (L) 12 - 46 %   Lymphs Abs 0.3 (L) 0.7 - 4.0 K/uL   Monocytes Relative 8 3 - 12 %   Monocytes Absolute 0.9 0.1 - 1.0 K/uL   Eosinophils Relative 0 0 - 5 %   Eosinophils Absolute 0.0 0.0 - 0.7 K/uL   Basophils Relative 0 0 - 1 %   Basophils Absolute 0.0 0.0 - 0.1 K/uL  Comprehensive metabolic panel     Status: Abnormal   Collection Time: 10/11/14  3:00 AM  Result Value Ref Range   Sodium 133 (L) 135 - 145 mmol/L   Potassium 3.8 3.5 - 5.1 mmol/L   Chloride 98 (L) 101 - 111 mmol/L   CO2 23 22 - 32 mmol/L   Glucose, Bld 140 (H) 65 - 99 mg/dL   BUN 43 (H) 6 - 20 mg/dL   Creatinine, Ser 0.98 0.44 - 1.00 mg/dL   Calcium 8.8 (L) 8.9 - 10.3 mg/dL   Total Protein 6.9 6.5 - 8.1 g/dL   Albumin 3.7 3.5 - 5.0 g/dL   AST 53 (H) 15 - 41 U/L   ALT 95 (H) 14 - 54 U/L   Alkaline Phosphatase 56  38 - 126 U/L   Total Bilirubin 1.5 (H) 0.3 - 1.2 mg/dL   GFR calc non Af Amer 49 (L) >60 mL/min   GFR calc Af Amer 57 (L) >60 mL/min    Comment: (NOTE) The eGFR has been calculated using the CKD EPI equation. This calculation has not been validated in all clinical situations. eGFR's persistently <60 mL/min signify possible Chronic Kidney Disease.    Anion gap 12 5 - 15  CK  Status: Abnormal   Collection Time: 10/11/14  3:00 AM  Result Value Ref Range   Total CK 541 (H) 38 - 234 U/L  Hemoglobin A1c     Status: Abnormal   Collection Time: 10/11/14  3:00 AM  Result Value Ref Range   Hgb A1c MFr Bld 6.0 (H) 4.8 - 5.6 %    Comment: (NOTE)         Pre-diabetes: 5.7 - 6.4         Diabetes: >6.4         Glycemic control for adults with diabetes: <7.0    Mean Plasma Glucose 126 mg/dL    Comment: (NOTE) Performed At: [facility] [address] [place], [place] [id number] Lindon Romp MD BU:[id number]   I-stat troponin, ED (not at [facility], [facility])     Status: Abnormal   Collection Time: 10/11/14  4:07 AM  Result Value Ref Range   Troponin i, poc 1.95 (HH) 0.00 - 0.08 ng/mL   Comment 3            Comment: Due to the release kinetics of cTnI, a negative result within the first hours of the onset of symptoms does not rule out myocardial infarction with certainty. If myocardial infarction is still suspected, repeat the test at appropriate intervals.   Urine Drug Screen     Status: None   Collection Time: 10/11/14  5:50 AM  Result Value Ref Range   Opiates NONE DETECTED NONE DETECTED   Cocaine NONE DETECTED NONE DETECTED   Benzodiazepines NONE DETECTED NONE DETECTED   Amphetamines NONE DETECTED NONE DETECTED   Tetrahydrocannabinol NONE DETECTED NONE DETECTED   Barbiturates NONE DETECTED NONE DETECTED    Comment:        DRUG SCREEN FOR MEDICAL PURPOSES ONLY.  IF CONFIRMATION IS NEEDED FOR ANY PURPOSE, NOTIFY LAB WITHIN 5 DAYS.        LOWEST DETECTABLE LIMITS FOR  URINE DRUG SCREEN Drug Class       Cutoff (ng/mL) Amphetamine      1000 Barbiturate      200 Benzodiazepine   321 Tricyclics       224 Opiates          300 Cocaine          300 THC              50   Urinalysis, Routine w reflex microscopic     Status: Abnormal   Collection Time: 10/11/14  5:50 AM  Result Value Ref Range   Color, Urine YELLOW YELLOW   APPearance HAZY (A) CLEAR   Specific Gravity, Urine 1.025 1.005 - 1.030   pH 5.5 5.0 - 8.0   Glucose, UA NEGATIVE NEGATIVE mg/dL   Hgb urine dipstick MODERATE (A) NEGATIVE   Bilirubin Urine NEGATIVE NEGATIVE   Ketones, ur 15 (A) NEGATIVE mg/dL   Protein, ur 30 (A) NEGATIVE mg/dL   Urobilinogen, UA 0.2 0.0 - 1.0 mg/dL   Nitrite NEGATIVE NEGATIVE   Leukocytes, UA NEGATIVE NEGATIVE  Urine microscopic-add on     Status: Abnormal   Collection Time: 10/11/14  5:50 AM  Result Value Ref Range   WBC, UA 3-6 <3 WBC/hpf   RBC / HPF 7-10 <3 RBC/hpf   Bacteria, UA MANY (A) RARE  Glucose, capillary     Status: Abnormal   Collection Time: 10/11/14  7:50 AM  Result Value Ref Range   Glucose-Capillary 116 (H) 65 - 99 mg/dL   Comment 1 Notify  RN   Glucose, capillary     Status: Abnormal   Collection Time: 10/11/14 12:03 PM  Result Value Ref Range   Glucose-Capillary 109 (H) 65 - 99 mg/dL   Comment 1 Notify RN   Glucose, capillary     Status: Abnormal   Collection Time: 10/11/14  4:33 PM  Result Value Ref Range   Glucose-Capillary 103 (H) 65 - 99 mg/dL   Comment 1 Notify RN    Comment 2 Document in Chart   Urine rapid drug screen (hosp performed)     Status: None   Collection Time: 10/11/14  4:35 PM  Result Value Ref Range   Opiates NONE DETECTED NONE DETECTED   Cocaine NONE DETECTED NONE DETECTED   Benzodiazepines NONE DETECTED NONE DETECTED   Amphetamines NONE DETECTED NONE DETECTED   Tetrahydrocannabinol NONE DETECTED NONE DETECTED   Barbiturates NONE DETECTED NONE DETECTED    Comment:        DRUG SCREEN FOR MEDICAL  PURPOSES ONLY.  IF CONFIRMATION IS NEEDED FOR ANY PURPOSE, NOTIFY LAB WITHIN 5 DAYS.        LOWEST DETECTABLE LIMITS FOR URINE DRUG SCREEN Drug Class       Cutoff (ng/mL) Amphetamine      1000 Barbiturate      200 Benzodiazepine   025 Tricyclics       427 Opiates          300 Cocaine          300 THC              50   Glucose, capillary     Status: None   Collection Time: 10/11/14 10:13 PM  Result Value Ref Range   Glucose-Capillary 94 65 - 99 mg/dL  Fasting lipid panel     Status: Abnormal   Collection Time: 10/12/14  6:48 AM  Result Value Ref Range   Cholesterol 120 0 - 200 mg/dL   Triglycerides 78 <150 mg/dL   HDL 33 (L) >40 mg/dL   Total CHOL/HDL Ratio 3.6 RATIO   VLDL 16 0 - 40 mg/dL   LDL Cholesterol 71 0 - 99 mg/dL    Comment:        Total Cholesterol/HDL:CHD Risk Coronary Heart Disease Risk Table                     Men   Women  1/2 Average Risk   3.4   3.3  Average Risk       5.0   4.4  2 X Average Risk   9.6   7.1  3 X Average Risk  23.4   11.0        Use the calculated Patient Ratio above and the CHD Risk Table to determine the patient's CHD Risk.        ATP III CLASSIFICATION (LDL):  <100     mg/dL   Optimal  100-129  mg/dL   Near or Above                    Optimal  130-159  mg/dL   Borderline  160-189  mg/dL   High  >190     mg/dL   Very High   Glucose, capillary     Status: None   Collection Time: 10/12/14  7:43 AM  Result Value Ref Range   Glucose-Capillary 95 65 - 99 mg/dL  Glucose, capillary     Status: None   Collection Time: 10/12/14  11:42 AM  Result Value Ref Range   Glucose-Capillary 86 65 - 99 mg/dL  Glucose, capillary     Status: None   Collection Time: 10/12/14  4:31 PM  Result Value Ref Range   Glucose-Capillary 77 65 - 99 mg/dL  Troponin I     Status: Abnormal   Collection Time: 10/12/14  6:14 PM  Result Value Ref Range   Troponin I 0.95 (HH) <0.031 ng/mL    Comment: RESULT REPEATED AND VERIFIED CRITICAL RESULT CALLED TO,  READ BACK BY AND VERIFIED WITH: LEE,B AT 1930 ON 10/12/2014 BY ISLEY,B        POSSIBLE MYOCARDIAL ISCHEMIA. SERIAL TESTING RECOMMENDED.   Glucose, capillary     Status: None   Collection Time: 10/12/14  7:48 PM  Result Value Ref Range   Glucose-Capillary 88 65 - 99 mg/dL    Studies/Results:  HEAD CT 1. Findings consistent with large subacute infarct in the right MCA distribution. Likely some degree of surrounding edema but no midline shift. 2. Background atrophy and chronic small vessel ischemic change.   Magnum Lunde A. Merlene Welch, M.D.  Diplomate, Tax adviser of Psychiatry and Neurology ( Neurology). 10/12/2014, 7:56 PM

## 2014-10-12 NOTE — Progress Notes (Signed)
UR chart review completed.  

## 2014-10-12 NOTE — Progress Notes (Addendum)
[facility] PROGRESS NOTE  Tara Welch NWG:[id number] DOB: 25-Dec-1921 DOA: 10/11/2014 PCP: No primary care provider on file.  Assessment/Plan: 1. CVA- patient has left dense hemiplegia, from subacute acute right MCA territory infarct,admitting physician Dr. Truman Hayward had called patient's son and explained that patient could get worse in 1-2 days. She was made DO NOT RESUSCITATE. Neurology was consulted, consult is still pending. Neurology to see tonight. No further workup including MRI echo or carotid was ordered by the admitting physician. Will await neurology recommendations regarding further workup. Continue aspirin. 2. Elevated troponin- patient has mild elevation of troponin 1.95, no chest pain but has multiple PVCs on telemetry. Will repeat another troponin. I doubt that patient is a candidate for any aggressive intervention considering large MCA territory infarct with dense hemiplegia. 3. DVT prophylaxis- heparin  Code Status: DO NOT RESUSCITATE Family Communication: No family at bedside Disposition Plan: Remains inpatient   Consultants:  Neurology  Procedures:  None  Antibiotics:  None  HPI/Subjective: 81 year old female brought to the hospital with altered mental status and left-sided weakness, CT head showed large subacute CVA in the MCA territory. Mild elevation of troponin 1.95.  Patient continues to have left-sided weakness today, continues to be nothing by mouth  Objective: Filed Vitals:   10/12/14 1420  BP: 131/59  Pulse: 61  Temp: 97.5 F (36.4 C)  Resp: 18    Intake/Output Summary (Last 24 hours) at 10/12/14 1504 Last data filed at 10/12/14 1421  Gross per 24 hour  Intake  542.5 ml  Output    225 ml  Net  317.5 ml   Filed Weights   10/12/14 0500  Weight: 55.021 kg (121 lb 4.8 oz)    Exam:   General:  Appears in no acute distress  Cardiovascular: S1-S2 regular  Respiratory: Clear to auscultation bilaterally  Abdomen: Soft, nontender, no  organomegaly  Neurological- hemiplegia on left   Data Reviewed: Basic Metabolic Panel:  Recent Labs Lab 10/11/14 0300  NA 133*  K 3.8  CL 98*  CO2 23  GLUCOSE 140*  BUN 43*  CREATININE 0.98  CALCIUM 8.8*   Liver Function Tests:  Recent Labs Lab 10/11/14 0300  AST 53*  ALT 95*  ALKPHOS 56  BILITOT 1.5*  PROT 6.9  ALBUMIN 3.7   No results for input(s): LIPASE, AMYLASE in the last 168 hours. No results for input(s): AMMONIA in the last 168 hours. CBC:  Recent Labs Lab 10/11/14 0300  WBC 11.1*  NEUTROABS 9.9*  HGB 12.5  HCT 37.5  MCV 94.2  PLT 208   Cardiac Enzymes:  Recent Labs Lab 10/11/14 0300  CKTOTAL 541*    CBG:  Recent Labs Lab 10/11/14 1203 10/11/14 1633 10/11/14 2213 10/12/14 0743 10/12/14 1142  GLUCAP 109* 103* 94 95 86    No results found for this or any previous visit (from the past 240 hour(s)).   Studies: Ct Head Wo Contrast  10/11/2014   CLINICAL DATA:  Stroke symptoms. Last known normal at lunch yesterday.  EXAM: CT HEAD WITHOUT CONTRAST  TECHNIQUE: Contiguous axial images were obtained from the base of the skull through the vertex without intravenous contrast.  COMPARISON:  None.  FINDINGS: There is rather extensive hypodensity involving right temporal, frontal, and parietal lobes in the MCA distribution consistent with subacute infarct. There is likely some degree of surrounding edema. No definite associated midline shift. There is no intracranial hemorrhage. Periventricular white matter hypodensity is nonspecific, likely chronic small vessel ischemia. No intracranial or subdural  collection. Age related atrophy. Atherosclerosis of the intracranial vasculature at the skull base. The calvarium is intact. There is mucosal thickening of the ethmoid air cells. The mastoid air cells are well aerated.  IMPRESSION: 1. Findings consistent with large subacute infarct in the right MCA distribution. Likely some degree of surrounding edema but no  midline shift. 2. Background atrophy and chronic small vessel ischemic change. These results were called by telephone at the time of interpretation on 10/11/2014 at 3:30 am to Dr. Shanon Rosser , who verbally acknowledged these results.   Electronically Signed   By: Jeb Levering M.D.   On: 10/11/2014 03:42    Scheduled Meds: . antiseptic oral rinse  7 mL Mouth Rinse q12n4p  . aspirin  300 mg Rectal Daily  . chlorhexidine  15 mL Mouth Rinse BID  . heparin  5,000 Units Subcutaneous 3 times per day   Continuous Infusions: . sodium chloride 50 mL/hr at 10/12/14 3837    Principal Problem:   Acute CVA (cerebrovascular accident)    Time spent: 25 min    [facility]  [facility] Pager [phone]*. If 7PM-7AM, please contact night-coverage at [email or web address], password [facility] 10/12/2014, 3:04 PM  LOS: 1 day

## 2014-10-12 NOTE — Progress Notes (Signed)
Cardiac monitor displaying new dysrhythmia--patient in atrial fibrillation with heart rate increased and sustaining in 120s-130s. Patient asymptomatic. Dr. Darrick Meigs notified. EKG 12-lead ordered. EKG completed and results discussed with Dr. Darrick Meigs. Orders for transfer to ICU given.

## 2014-10-13 ENCOUNTER — Inpatient Hospital Stay (HOSPITAL_COMMUNITY): Payer: Medicare Other

## 2014-10-13 ENCOUNTER — Inpatient Hospital Stay (INDEPENDENT_AMBULATORY_CARE_PROVIDER_SITE_OTHER): Payer: Medicare Other

## 2014-10-13 DIAGNOSIS — R001 Bradycardia, unspecified: Secondary | ICD-10-CM

## 2014-10-13 DIAGNOSIS — G8190 Hemiplegia, unspecified affecting unspecified side: Secondary | ICD-10-CM

## 2014-10-13 DIAGNOSIS — I6350 Cerebral infarction due to unspecified occlusion or stenosis of unspecified cerebral artery: Secondary | ICD-10-CM | POA: Diagnosis not present

## 2014-10-13 DIAGNOSIS — R1314 Dysphagia, pharyngoesophageal phase: Secondary | ICD-10-CM

## 2014-10-13 LAB — BASIC METABOLIC PANEL
Anion gap: 9 (ref 5–15)
BUN: 34 mg/dL — ABNORMAL HIGH (ref 6–20)
CALCIUM: 8.2 mg/dL — AB (ref 8.9–10.3)
CHLORIDE: 105 mmol/L (ref 101–111)
CO2: 23 mmol/L (ref 22–32)
Creatinine, Ser: 0.73 mg/dL (ref 0.44–1.00)
GFR calc Af Amer: >60 mL/min (ref >60)
GFR calc non Af Amer: >60 mL/min (ref >60)
Glucose, Bld: 138 mg/dL — ABNORMAL HIGH (ref 65–99)
Potassium: 3.2 mmol/L — ABNORMAL LOW (ref 3.5–5.1)
Sodium: 137 mmol/L (ref 135–145)

## 2014-10-13 LAB — TSH: TSH: 2.784 u[IU]/mL (ref 0.350–4.500)

## 2014-10-13 LAB — CBC
HCT: 32.9 % — ABNORMAL LOW (ref 36.0–46.0)
Hemoglobin: 11 g/dL — ABNORMAL LOW (ref 12.0–15.0)
MCH: 31.9 pg (ref 26.0–34.0)
MCHC: 33.4 g/dL (ref 30.0–36.0)
MCV: 95.4 fL (ref 78.0–100.0)
Platelets: 184 10*3/uL (ref 150–400)
RBC: 3.45 MIL/uL — ABNORMAL LOW (ref 3.87–5.11)
RDW: 13.9 % (ref 11.5–15.5)
WBC: 7.3 10*3/uL (ref 4.0–10.5)

## 2014-10-13 LAB — GLUCOSE, CAPILLARY
GLUCOSE-CAPILLARY: 132 mg/dL — AB (ref 65–99)
GLUCOSE-CAPILLARY: 127 mg/dL — AB (ref 65–99)
GLUCOSE-CAPILLARY: 156 mg/dL — AB (ref 65–99)
Glucose-Capillary: 107 mg/dL — ABNORMAL HIGH (ref 65–99)
Glucose-Capillary: 122 mg/dL — ABNORMAL HIGH (ref 65–99)

## 2014-10-13 MED ORDER — METOPROLOL TARTRATE 1 MG/ML IV SOLN
5.0000 mg | Freq: Once | INTRAVENOUS | Status: AC
  Administered 2014-10-13: 5 mg via INTRAVENOUS
  Filled 2014-10-13: qty 5

## 2014-10-13 NOTE — Progress Notes (Signed)
PT Cancellation Note  Patient Details Name: Tara Welch MRN: [id number] DOB: 1922-02-09   Cancelled Treatment:    Reason Eval/Treat Not Completed: Fatigue/lethargy limiting ability to participate. Pt recently returned from MRI. Pt sleeping soundly upon entry. Unable to rouse pt with repeated verbal attempts nor with light to moderate touching or gentle shaking. Chart reviewed, RN consulted. Holding pt treatment at this time due to somnolence. Will attempt at later date/time. US/radiology entering room upon exit reporting that patient remained in similar state during MRI.    Giani Winther C 10/13/2014, 1:15 PM  1:18 PM  Etta Grandchild, PT, DPT [place] License # [id number]

## 2014-10-13 NOTE — Progress Notes (Signed)
SLP Cancellation Note  Patient Details Name: Tara Welch MRN: [id number] DOB: [birth year]   Cancelled treatment:       Reason Eval/Treat Not Completed: Fatigue/lethargy limiting ability to participate;Patient's level of consciousness; Pt transferred to ICU over night and is currently not alert enough for evaluation. SLP will follow pending goals of care.  Thank you,  Genene Churn, [place]    [facility] 10/13/2014, 2:31 PM

## 2014-10-13 NOTE — Progress Notes (Signed)
Dr. Roderic Palau notified of change in mental status and increase in HR. Dr. Roderic Palau to round again shortly and will reevaluate. Will continue to monitor.

## 2014-10-13 NOTE — Progress Notes (Signed)
E-link updated on pt hr 90's to low 100  Since 0027 IV lopressor given for hr & elevated BP & @ 0129 pt  asystole for 5.6seconds  & then returned to hr 50's. Current hr 57 A-fib

## 2014-10-13 NOTE — Progress Notes (Signed)
[facility] PROGRESS NOTE  Tara Welch QVZ:[id number] DOB: 1921-10-12 DOA: 10/11/2014 PCP: No primary care provider on file.  Assessment/Plan: 1. CVA- patient has left dense hemiplegia, from subacute acute right MCA territory infarctShe was made DO NOT RESUSCITATE. Neurology input appreciated. Will order MRI brain, carotid dopplers and echo. Continue aspirin. Physical therapy consultations pending. 2. Dysphagia related to stroke. Speech therapy has seen the patient and has recommended NPO for now. Will re evaluate today, may need MBSS. If she does not progress, may need to discuss further feeding options 3. Elevated troponin- likely demand ischemia in the setting of large stroke. Continue aspirin. Discussed with cardiology and patient is not felt to be a candidate for aggressive intervention.  4. Bradycardia. Patient had developed significant bradycardia yesterday with heart rate occasionally dipping down into the 30's. Bradycardia can often occur after patient's have suffered a large stroke. In addition, patient did receive a dose of lopressor overnight. Discussed with cardiology and recommendations were to avoid further beta blockers. Review of home medications indicate that she takes Toprol 50mg  daily. Will continue to monitor heart rate and give low doses of beta blockers very cautiously as needed. Check TSH.  5. DVT prophylaxis- heparin  Code Status: DO NOT RESUSCITATE Family Communication: No family at bedside Disposition Plan: Remains inpatient   Consultants:  Neurology  Procedures:  None  Antibiotics:  None  HPI/Subjective: 81 year old female brought to the hospital with altered mental status and left-sided weakness, CT head showed large subacute CVA in the MCA territory. Mild elevation of troponin 1.95.  Patient denies any new complaints today. Does not acknowledge weakness on left side, no shortness of breath  Objective: Filed Vitals:   10/13/14 0900  BP: 140/56   Pulse: 52  Temp:   Resp: 17    Intake/Output Summary (Last 24 hours) at 10/13/14 1012 Last data filed at 10/13/14 0600  Gross per 24 hour  Intake 658.75 ml  Output    750 ml  Net -91.25 ml   Filed Weights   10/12/14 0500 10/12/14 2000  Weight: 55.021 kg (121 lb 4.8 oz) 55.4 kg (122 lb 2.2 oz)    Exam:   General:  Appears in no acute distress  Cardiovascular: S1-S2 regular  Respiratory: Clear to auscultation bilaterally  Abdomen: Soft, nontender, no organomegaly  Neurological- hemiplegia on left   Data Reviewed: Basic Metabolic Panel:  Recent Labs Lab 10/11/14 0300  NA 133*  K 3.8  CL 98*  CO2 23  GLUCOSE 140*  BUN 43*  CREATININE 0.98  CALCIUM 8.8*   Liver Function Tests:  Recent Labs Lab 10/11/14 0300  AST 53*  ALT 95*  ALKPHOS 56  BILITOT 1.5*  PROT 6.9  ALBUMIN 3.7   No results for input(s): LIPASE, AMYLASE in the last 168 hours. No results for input(s): AMMONIA in the last 168 hours. CBC:  Recent Labs Lab 10/11/14 0300  WBC 11.1*  NEUTROABS 9.9*  HGB 12.5  HCT 37.5  MCV 94.2  PLT 208   Cardiac Enzymes:  Recent Labs Lab 10/11/14 0300 10/12/14 1814  CKTOTAL 541*  --   TROPONINI  --  0.95*    CBG:  Recent Labs Lab 10/12/14 1631 10/12/14 1948 10/13/14 0009 10/13/14 0445 10/13/14 0739  GLUCAP 77 88 107* 132* 122*    Recent Results (from the past 240 hour(s))  MRSA PCR Screening     Status: None   Collection Time: 10/12/14  7:20 PM  Result Value Ref Range Status  MRSA by PCR NEGATIVE NEGATIVE Final    Comment:        The GeneXpert MRSA Assay (FDA approved for NASAL specimens only), is one component of a comprehensive MRSA colonization surveillance program. It is not intended to diagnose MRSA infection nor to guide or monitor treatment for MRSA infections.      Studies: No results found.  Scheduled Meds: . antiseptic oral rinse  7 mL Mouth Rinse q12n4p  . aspirin  300 mg Rectal Daily  . chlorhexidine   15 mL Mouth Rinse BID  . heparin  5,000 Units Subcutaneous 3 times per day   Continuous Infusions: . dextrose 5 % and 0.45% NaCl 75 mL/hr at 10/13/14 0600    Principal Problem:   Acute CVA (cerebrovascular accident)    Time spent: 25 min    [facility] Pager [phone]. If 7PM-7AM, please contact night-coverage at [email or web address], password [facility] 10/13/2014, 10:12 AM  LOS: 2 days

## 2014-10-13 NOTE — Progress Notes (Addendum)
Patient ID: Tara Welch, female   DOB: [birth year], 81 y.o.   MRN: [id number]  [place] A. Merlene Laughter, MD     [email or web address]          Tara Welch is an 81 y.o. female.   Assessment/Plan: 1. 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 Somewhat fluctuating level of consciousness but imaging does not show evidence of massive edema. Again, suspect that she should do okay from a stroke but secondary complications of sepsis, swallowing, aspiration pneumonia and cardiac arrhythmias are concerning.  2. Bradycardia dysrhythmia likely due to large cortical infarct.  Recommendation: Address feeding - nutrition longerm. Tube versus comfort care.  ASA  It appears that the patient has been a lot more unresponsive today the daytime. However, she is more alert during my evaluation tonight. She reports no complaint at this time. MRI seemed to show the same amount of edema as her CT scan on yesterday.    GENERAL: She is in no acute distress.  HEENT: Supple. Atraumatic normocephalic.   ABDOMEN: soft  EXTREMITIES: No edema   BACK: Normal.  SKIN: Normal by inspection.   MENTAL STATUS: She is awake and alert. She knows the season the hospital at [facility]. She states the year is 2016 and the month as May. Her age is stated as 81. There is moderate dysarthria. She follows commands well especially on the right side. There is dense visual and hemibody neglect on the left side.  CRANIAL NERVES: Pupils are equal, round and reactive to light and accommodation; extra ocular movements are full, there is no significant nystagmus; visual fields shows a dense left homonymous hemianopia; upper and lower facial muscles are normal in strength and symmetric, there is mild flattening of the nasolabial fold-L; tongue is midline;   MOTOR: Normal tone, bulk and strength; no pronator drift-R. Left hemiplegia.  COORDINATION: No dysmetria is no tremors.  REFLEXES: Deep  tendon reflexes are symmetrical and normal. Babinski reflexes are flexor bilaterally.   SENSATION: Reduced sensation to light touch and temperature left side.       Objective: Vital signs in last 24 hours: Temp:  [97.6 F (36.4 C)-98.4 F (36.9 C)] 97.6 F (36.4 C) (06/01 1500) Pulse Rate:  [40-124] 62 (06/01 1800) Resp:  [15-24] 19 (06/01 1800) BP: (90-160)/(43-116) 143/65 mmHg (06/01 1800) SpO2:  [93 %-99 %] 98 % (06/01 1800) Weight:  [55.4 kg (122 lb 2.2 oz)] 55.4 kg (122 lb 2.2 oz) (05/31 2000)  Intake/Output from previous day: 05/31 0701 - 06/01 0700 In: 658.8 [I.V.:658.8] Out: 750 [Urine:750] Intake/Output this shift:   Nutritional status: Diet NPO time specified   Lab Results: Results for orders placed or performed during the hospital encounter of 10/11/14 (from the past 48 hour(s))  Glucose, capillary     Status: None   Collection Time: 10/11/14 10:13 PM  Result Value Ref Range   Glucose-Capillary 94 65 - 99 mg/dL  Fasting lipid panel     Status: Abnormal   Collection Time: 10/12/14  6:48 AM  Result Value Ref Range   Cholesterol 120 0 - 200 mg/dL   Triglycerides 78 <150 mg/dL   HDL 33 (L) >40 mg/dL   Total CHOL/HDL Ratio 3.6 RATIO   VLDL 16 0 - 40 mg/dL   LDL Cholesterol 71 0 - 99 mg/dL    Comment:        Total Cholesterol/HDL:CHD Risk Coronary Heart Disease Risk Table  Men   Women  1/2 Average Risk   3.4   3.3  Average Risk       5.0   4.4  2 X Average Risk   9.6   7.1  3 X Average Risk  23.4   11.0        Use the calculated Patient Ratio above and the CHD Risk Table to determine the patient's CHD Risk.        ATP III CLASSIFICATION (LDL):  <100     mg/dL   Optimal  100-129  mg/dL   Near or Above                    Optimal  130-159  mg/dL   Borderline  160-189  mg/dL   High  >190     mg/dL   Very High   Glucose, capillary     Status: None   Collection Time: 10/12/14  7:43 AM  Result Value Ref Range   Glucose-Capillary 95  65 - 99 mg/dL  Glucose, capillary     Status: None   Collection Time: 10/12/14 11:42 AM  Result Value Ref Range   Glucose-Capillary 86 65 - 99 mg/dL  Glucose, capillary     Status: None   Collection Time: 10/12/14  4:31 PM  Result Value Ref Range   Glucose-Capillary 77 65 - 99 mg/dL  Troponin I     Status: Abnormal   Collection Time: 10/12/14  6:14 PM  Result Value Ref Range   Troponin I 0.95 (HH) <0.031 ng/mL    Comment: RESULT REPEATED AND VERIFIED CRITICAL RESULT CALLED TO, READ BACK BY AND VERIFIED WITH: LEE,B AT 1930 ON 10/12/2014 BY ISLEY,B        POSSIBLE MYOCARDIAL ISCHEMIA. SERIAL TESTING RECOMMENDED.   MRSA PCR Screening     Status: None   Collection Time: 10/12/14  7:20 PM  Result Value Ref Range   MRSA by PCR NEGATIVE NEGATIVE    Comment:        The GeneXpert MRSA Assay (FDA approved for NASAL specimens only), is one component of a comprehensive MRSA colonization surveillance program. It is not intended to diagnose MRSA infection nor to guide or monitor treatment for MRSA infections.   Glucose, capillary     Status: None   Collection Time: 10/12/14  7:48 PM  Result Value Ref Range   Glucose-Capillary 88 65 - 99 mg/dL  Glucose, capillary     Status: Abnormal   Collection Time: 10/13/14 12:09 AM  Result Value Ref Range   Glucose-Capillary 107 (H) 65 - 99 mg/dL  Glucose, capillary     Status: Abnormal   Collection Time: 10/13/14  4:45 AM  Result Value Ref Range   Glucose-Capillary 132 (H) 65 - 99 mg/dL  Glucose, capillary     Status: Abnormal   Collection Time: 10/13/14  7:39 AM  Result Value Ref Range   Glucose-Capillary 122 (H) 65 - 99 mg/dL   Comment 1 Notify RN    Comment 2 Document in Chart   CBC     Status: Abnormal   Collection Time: 10/13/14 10:51 AM  Result Value Ref Range   WBC 7.3 4.0 - 10.5 K/uL   RBC 3.45 (L) 3.87 - 5.11 MIL/uL   Hemoglobin 11.0 (L) 12.0 - 15.0 g/dL   HCT 32.9 (L) 36.0 - 46.0 %   MCV 95.4 78.0 - 100.0 fL   MCH 31.9  26.0 - 34.0 pg   MCHC 33.4 30.0 -  36.0 g/dL   RDW 13.9 11.5 - 15.5 %   Platelets 184 150 - 400 K/uL  Basic metabolic panel     Status: Abnormal   Collection Time: 10/13/14 10:51 AM  Result Value Ref Range   Sodium 137 135 - 145 mmol/L   Potassium 3.2 (L) 3.5 - 5.1 mmol/L   Chloride 105 101 - 111 mmol/L   CO2 23 22 - 32 mmol/L   Glucose, Bld 138 (H) 65 - 99 mg/dL   BUN 34 (H) 6 - 20 mg/dL   Creatinine, Ser 0.73 0.44 - 1.00 mg/dL   Calcium 8.2 (L) 8.9 - 10.3 mg/dL   GFR calc non Af Amer >60 >60 mL/min   GFR calc Af Amer >60 >60 mL/min    Comment: (NOTE) The eGFR has been calculated using the CKD EPI equation. This calculation has not been validated in all clinical situations. eGFR's persistently <60 mL/min signify possible Chronic Kidney Disease.    Anion gap 9 5 - 15  TSH     Status: None   Collection Time: 10/13/14 10:58 AM  Result Value Ref Range   TSH 2.784 0.350 - 4.500 uIU/mL  Glucose, capillary     Status: Abnormal   Collection Time: 10/13/14  4:08 PM  Result Value Ref Range   Glucose-Capillary 127 (H) 65 - 99 mg/dL   Comment 1 Notify RN    Comment 2 Document in Chart     Lipid Panel  Recent Labs  10/12/14 0648  CHOL 120  TRIG 78  HDL 33*  CHOLHDL 3.6  VLDL 16  LDLCALC 71    Studies/Results: BRAIN MRI/MRA 1. Large right MCA acute infarct with cytotoxic edema but no hemorrhage. Relatively mild mass effect at this time, with partial effacement of the right lateral ventricle but no midline shift. No ventriculomegaly. 2. Noncontrast head CT can be used for surveillance of intracranial mass effect. 3. Small lacunar type infarct also in the left superior frontal gyrus, favor synchronous small vessel disease.  CAROTID DOPPLERS NORMAL    Medications:  Scheduled Meds: . antiseptic oral rinse  7 mL Mouth Rinse q12n4p  . aspirin  300 mg Rectal Daily  . chlorhexidine  15 mL Mouth Rinse BID  . heparin  5,000 Units Subcutaneous 3 times per day    Continuous Infusions: . dextrose 5 % and 0.45% NaCl 75 mL/hr at 10/13/14 1045   PRN Meds:.     LOS: 2 days   Corwyn Vora A. Merlene Laughter, M.D.  Diplomate, Tax adviser of Psychiatry and Neurology ( Neurology).

## 2014-10-13 NOTE — Clinical Social Work Note (Signed)
Clinical Social Work Assessment  Patient Details  Name: Tara Welch MRN: [id number] Date of Birth: 1922-01-17  Date of referral:  10/13/14               Reason for consult:  Facility Placement                Permission sought to share information with:    Permission granted to share information::     Name::        Agency::     Relationship::     Contact Information:     Housing/Transportation Living arrangements for the past 2 months:  Single Family Home Source of Information:  Adult Children Patient Interpreter Needed:  None Criminal Activity/Legal Involvement Pertinent to Current Situation/Hospitalization:  No - Comment as needed Significant Relationships:  Adult Children Lives with:    Do you feel safe going back to the place where you live?  Yes Need for family participation in patient care:  Yes (Comment)  Care giving concerns:  Patient will likely need assistance in SNF for rehab upon discharge.     Social Worker assessment / plan:  Patient was asleep. Her son, Tara Welch, was at bedside.  Mr. Sokol advised that he and his mother live together.  He indicated that at baseline patient ambulates unassisted, completes her ADL's independently and drives. He stated that prior to this hospitalization, she has not needed any addition assistance with anything. He stated that he felt that patient would need SNF upon discharge as he would have a difficult time assisting her due to his diabetes. CSW provided Mr. Miedema with a SNF list. He advised that he would review the list. Mr. Escalante indicated that while he believed a SNF would be necessary, he felt that patient would not desire SNF placement.  CSW discussed that she would come back to speak to patient about her personal preferences/feelings related to her health issues and potential need for placement later.    Employment status:  Disabled (Comment on whether or not currently receiving Disability), Retired Forensic scientist:   Medicare PT Recommendations:    Information / Referral to community resources:  [place]  Patient/Family's Response to care:  Patient's son is agreeable. He feels patient may be less agreeable.  Patient/Family's Understanding of and Emotional Response to Diagnosis, Current Treatment, and Prognosis:  Mr. Burkman appeared to have some understanding of patient's diagnosis, treatment and prognosis.   Emotional Assessment Appearance:  Appears younger than stated age Attitude/Demeanor/Rapport:  Unable to Assess Affect (typically observed):  Unable to Assess Orientation:   (unable to assess) Alcohol / Substance use:    Psych involvement (Current and /or in the community):  No (Comment)  Discharge Needs  Concerns to be addressed:  Discharge Planning Concerns Readmission within the last 30 days:  No Current discharge risk:  None Barriers to Discharge:  No Barriers Identified   Ihor Gully, LCSW 10/13/2014, 12:54 PM [phone]

## 2014-10-14 DIAGNOSIS — G8194 Hemiplegia, unspecified affecting left nondominant side: Secondary | ICD-10-CM

## 2014-10-14 DIAGNOSIS — R001 Bradycardia, unspecified: Secondary | ICD-10-CM | POA: Diagnosis present

## 2014-10-14 DIAGNOSIS — R778 Other specified abnormalities of plasma proteins: Secondary | ICD-10-CM | POA: Diagnosis present

## 2014-10-14 DIAGNOSIS — R1314 Dysphagia, pharyngoesophageal phase: Secondary | ICD-10-CM | POA: Diagnosis present

## 2014-10-14 DIAGNOSIS — R7989 Other specified abnormal findings of blood chemistry: Secondary | ICD-10-CM

## 2014-10-14 LAB — CBC
HCT: 33.3 % — ABNORMAL LOW (ref 36.0–46.0)
Hemoglobin: 11 g/dL — ABNORMAL LOW (ref 12.0–15.0)
MCH: 31.7 pg (ref 26.0–34.0)
MCHC: 33 g/dL (ref 30.0–36.0)
MCV: 96 fL (ref 78.0–100.0)
Platelets: 185 10*3/uL (ref 150–400)
RBC: 3.47 MIL/uL — ABNORMAL LOW (ref 3.87–5.11)
RDW: 13.9 % (ref 11.5–15.5)
WBC: 7.5 10*3/uL (ref 4.0–10.5)

## 2014-10-14 LAB — GLUCOSE, CAPILLARY
Glucose-Capillary: 104 mg/dL — ABNORMAL HIGH (ref 65–99)
Glucose-Capillary: 116 mg/dL — ABNORMAL HIGH (ref 65–99)
Glucose-Capillary: 121 mg/dL — ABNORMAL HIGH (ref 65–99)
Glucose-Capillary: 125 mg/dL — ABNORMAL HIGH (ref 65–99)
Glucose-Capillary: 133 mg/dL — ABNORMAL HIGH (ref 65–99)
Glucose-Capillary: 151 mg/dL — ABNORMAL HIGH (ref 65–99)

## 2014-10-14 LAB — BASIC METABOLIC PANEL
Anion gap: 9 (ref 5–15)
BUN: 29 mg/dL — AB (ref 6–20)
CHLORIDE: 105 mmol/L (ref 101–111)
CO2: 23 mmol/L (ref 22–32)
CREATININE: 0.67 mg/dL (ref 0.44–1.00)
Calcium: 8 mg/dL — ABNORMAL LOW (ref 8.9–10.3)
GFR calc non Af Amer: >60 mL/min (ref >60)
Glucose, Bld: 140 mg/dL — ABNORMAL HIGH (ref 65–99)
POTASSIUM: 3.1 mmol/L — AB (ref 3.5–5.1)
Sodium: 137 mmol/L (ref 135–145)

## 2014-10-14 MED ORDER — KCL IN DEXTROSE-NACL 20-5-0.45 MEQ/L-%-% IV SOLN
INTRAVENOUS | Status: DC
  Administered 2014-10-14 – 2014-10-15 (×3): via INTRAVENOUS

## 2014-10-14 MED ORDER — POTASSIUM CHLORIDE 2 MEQ/ML IV SOLN: INTRAVENOUS | Status: DC

## 2014-10-14 MED ORDER — POTASSIUM CHLORIDE 10 MEQ/100ML IV SOLN
10.0000 meq | INTRAVENOUS | Status: AC
  Administered 2014-10-14 (×6): 10 meq via INTRAVENOUS
  Filled 2014-10-14 (×2): qty 100

## 2014-10-14 NOTE — Evaluation (Signed)
Occupational Therapy Evaluation Patient Details Name: Tara Welch MRN: [id number] DOB: 05-06-1922 Today's Date: 10/14/2014    History of Present Illness Pt is a 81 year old female who lives with her son independently.  She sustained a right MCA territory stroke and now has dense left hemiparesis.   Clinical Impression   Pt awake, alert, and cooperative this am; oriented x2. PTA pt lived with son and was independent in all B/IADL tasks per pt report and chart review. Pt seen along with PT this am, for pt/therapist safety. Pt requires total assist in bed mobility. Pt has no sensation and no active movement in LUE. Pt is beginning to have an increase in flexor tone in the LUE digits. Pt positioned in supine with LUE supported by two pillows. Pt demonstrates left homonymous hemianopsia and left neglect, requiring max verbal cuing to look to the left. Recommend SNF on discharge, pt is in agreement stating "whatever it takes to get better."     Follow Up Recommendations  SNF;Supervision/Assistance - 24 hour          Precautions / Restrictions Precautions Precautions: Fall Restrictions Weight Bearing Restrictions: No      Mobility Bed Mobility Overal bed mobility: Needs Assistance;+2 for physical assistance Bed Mobility: Supine to Sit;Sit to Supine     Supine to sit: +2 for physical assistance Sit to supine: +2 for physical assistance      Transfers                 General transfer comment: pt not transferred this AM    Balance Overall balance assessment: Needs assistance Sitting-balance support: Single extremity supported;Feet supported Sitting balance-Leahy Scale: Poor Sitting balance - Comments: tends to fall left and posteriorly                                    ADL Overall ADL's : Needs assistance/impaired                                       General ADL Comments: Pt will required max-total assistance during BADL tasks due to  severity of impairments and decreased awareness of deficits     Vision Vision Assessment?: Yes Visual Fields: Left homonymous hemianopsia          Pertinent Vitals/Pain Pain Assessment: No/denies pain        Extremity/Trunk Assessment Upper Extremity Assessment Upper Extremity Assessment: LUE deficits/detail LUE Deficits / Details: No active movement in LUE; increased flexor tone in digits LUE Sensation: decreased light touch (Pt has no sensation in LUE)   Lower Extremity Assessment Lower Extremity Assessment: Defer to PT evaluation LLE Deficits / Details: no active motion in LLE with mild extensor tone LLE Sensation: decreased light touch   Cervical / Trunk Assessment Cervical / Trunk Assessment: Other exceptions Cervical / Trunk Exceptions: mild extensor tone noted in trunk...pt maintains head flexed to the left and turned to the right but is able to turn head minimally to the left   Communication Communication Communication: No difficulties   Cognition Arousal/Alertness: Awake/alert Behavior During Therapy: WFL for tasks assessed/performed Overall Cognitive Status: Within Functional Limits for tasks assessed  Home Living Family/patient expects to be discharged to:: Skilled nursing facility                                        Prior Functioning/Environment Level of Independence: Independent             OT Diagnosis: Hemiplegia non-dominant side   OT Problem List: Impaired UE functional use;Impaired tone;Impaired sensation;Decreased safety awareness;Impaired vision/perception;Impaired balance (sitting and/or standing);Decreased activity tolerance;Decreased range of motion;Decreased strength   OT Treatment/Interventions: Self-care/ADL training;Therapeutic exercise;Manual therapy;Splinting;Patient/family education;Visual/perceptual remediation/compensation;Therapeutic activities    OT Goals(Current  goals can be found in the care plan section) Acute Rehab OT Goals Patient Stated Goal: none stated OT Goal Formulation: With patient Time For Goal Achievement: 10/28/14 Potential to Achieve Goals: Fair  OT Frequency: Min 2X/week           Co-evaluation   Reason for Co-Treatment: Complexity of the patient's impairments (multi-system involvement);For patient/therapist safety PT goals addressed during session: Balance        End of Session    Activity Tolerance: Patient limited by fatigue Patient left: in bed;with call bell/phone within reach   Time: 0830-0905 OT Time Calculation (min): 35 min Charges:  OT General Charges $OT Visit: 1 Procedure OT Evaluation $Initial OT Evaluation Tier I: 1 Procedure  Guadelupe Sabin, OTR/L  [phone]  10/14/2014, 10:04 AM

## 2014-10-14 NOTE — Progress Notes (Signed)
Spoke with Joelyn Oms, son, he will be available at the home phone until 10:00AM then will be here at the hospital.  Palliative team can contact him there, or here at those times.

## 2014-10-14 NOTE — Plan of Care (Signed)
Problem: Acute Rehab OT Goals (only OT should resolve) Goal: Pt. Will Perform Eating Pt will be mod assist in feeding at bed level, provided pt completes oral intake, and will use compensatory visual strategies to complete task.

## 2014-10-14 NOTE — Evaluation (Addendum)
Physical Therapy Evaluation Patient Details Name: Tara Welch MRN: [id number] DOB: Feb 28, 1922 Today's Date: 10/14/2014   History of Present Illness  Pt is a 81 year old female who lives with her son independently.  She sustained a right MCA territory stroke and now has dense left hemiparesis.  Clinical Impression   Pt was seen for initial evaluation in conjunction with OT.  She was alert and very pleasant/cooperative.  She was able to follow directions about 50% of the time.  She is noted to have significant neglect of the left side with head laterally bent to the left and rotated right.  She has homonymous hemianopsia.  Pt has no active motion of the LLE with mild extensor tone.  She has minimal control of her trunk and poor sitting balance.  She requires total assistance for transfers.  We will recommend SNF at d/c and pt is in agreement.    Follow Up Recommendations SNF    Equipment Recommendations  None recommended by PT    Recommendations for Other Services   OT    Precautions / Restrictions Precautions Precautions: Fall Restrictions Weight Bearing Restrictions: No      Mobility  Bed Mobility Overal bed mobility: +2 for physical assistance                Transfers  not attempted but will need total assist               General transfer comment: pt not transferred this AM  Ambulation/Gait    unable                                Modified Rankin (Stroke Patients Only) Modified Rankin (Stroke Patients Only) Pre-Morbid Rankin Score: No symptoms Modified Rankin: Severe disability     Balance Overall balance assessment: Needs assistance Sitting-balance support: Single extremity supported;Feet supported Sitting balance-Leahy Scale: Poor Sitting balance - Comments: tends to fall left and posteriorly                                     Pertinent Vitals/Pain Pain Assessment: No/denies pain    Home Living Family/patient  expects to be discharged to:: Skilled nursing facility                      Prior Function Level of Independence: Independent               Hand Dominance    right    Extremity/Trunk Assessment   Upper Extremity Assessment: Defer to OT evaluation           Lower Extremity Assessment: LLE deficits/detail   LLE Deficits / Details: no active motion in LLE with mild extensor tone  Cervical / Trunk Assessment: Other exceptions  Communication   Communication: No difficulties  Cognition Arousal/Alertness: Awake/alert Behavior During Therapy: WFL for tasks assessed/performed Overall Cognitive Status: Within Functional Limits for tasks assessed                                    Assessment/Plan    PT Assessment Patient needs continued PT services  PT Diagnosis Hemiplegia non-dominant side   PT Problem List Decreased strength;Decreased activity tolerance;Decreased balance;Decreased mobility;Decreased knowledge of use of DME;Decreased safety awareness;Impaired sensation  PT Treatment  Interventions Functional mobility training;Therapeutic exercise;Therapeutic activities;Balance training;Neuromuscular re-education   PT Goals (Current goals can be found in the Care Plan section) Acute Rehab PT Goals Patient Stated Goal: none stated PT Goal Formulation: With patient Time For Goal Achievement: 10/28/14 Potential to Achieve Goals: Fair    Frequency Min 5X/week   Barriers to discharge   none known    Co-evaluation PT/OT/SLP Co-Evaluation/Treatment: Yes Reason for Co-Treatment: Complexity of the patient's impairments (multi-system involvement);For patient/therapist safety PT goals addressed during session: Balance         End of Session Equipment Utilized During Treatment: Gait belt Activity Tolerance: Patient tolerated treatment well Patient left: in bed;with call bell/phone within reach;with bed alarm set Nurse Communication: Mobility  status         Time: 0511-0211 PT Time Calculation (min) (ACUTE ONLY): 35 min   Charges:   PT Evaluation $Initial PT Evaluation Tier I: 1 Procedure     PT G CodesDemetrios Isaacs L  PT 10/14/2014, 9:37 AM

## 2014-10-14 NOTE — Progress Notes (Signed)
Called report to [place], Therapist, sports on dept 300. Verbalized understanding.  Pt transferred to room 330 in safe and stable condition. Sch[place]witz, Eulis Canner 10/14/2014

## 2014-10-14 NOTE — Progress Notes (Signed)
Speech Language Pathology Treatment: Dysphagia  Patient Details Name: Tara Welch MRN: [id number] DOB: 12-Sep-1921 Today's Date: 10/14/2014 Time: 8811-0315 SLP Time Calculation (min) (ACUTE ONLY): 22 min  Assessment / Plan / Recommendation Clinical Impression  Ms. Duck is alert today and presents with obvious left neglect; max cues to turn midline. She immediately noticed the cup of ice water to her right and reached out to take a sip. She followed commands more promptly today and was able to lateralize her tongue better. She required mod verbal cues to manipulate ice chip in her mouth and to masticate and swallow. She presented with delayed cough after 2nd ice chip. Pt with left labial spillage with thin water and immediate strong cough/watery eyes after sequential swallows (pt with very poor awareness). Some improvement noted with nectars, however labial spillage still problematic. Pt tolerated puree with honey-thick liquid with mod/max verbal cues to continue swallow/lingual sweep. Recommend initiating D1/puree with HTL with 1:1 feeder assist and cues for pocketing/labial spillage on left side. F/U at SNF for diet tolerance and pt would eventually benefit from objective study (MBSS) once progress is shown with puree/HTL. Above to RN and Dr. Roderic Palau. 100% feeder assist/supervision   HPI Other Pertinent Information: Ms. Jacalynn Buzzell was brought to the hospital with AMS and left hemiplegia, from subacute acute right MCA territory infarct,admitting physician Dr. Truman Hayward had called patient's son and explained that patient could get worse in 1-2 days. She was made DO NOT RESUSCITATE. Neurology was consulted, consult is still pending. Neurology to see tonight. No further workup including MRI echo or carotid was ordered by the admitting physician. Will await neurology recommendations regarding further workup. Continue aspirin. Patient has mild elevation of troponin 1.95, no chest pain but has multiple PVCs on telemetry.  Pt failed RN swallow screen and clinical swallow evaluation ordered as part of stroke protocol. Pt lives at home with her son, but was reportedly very active PTA.   Pertinent Vitals Pain Assessment: No/denies pain  SLP Plan  Continue with current plan of care    Recommendations Diet recommendations: Dysphagia 1 (puree);Honey-thick liquid Liquids provided via: Cup;Teaspoon Medication Administration: Crushed with puree Supervision: Staff to assist with self feeding;Full supervision/cueing for compensatory strategies;Trained caregiver to feed patient Compensations: Externally pace;Minimize environmental distractions;Slow rate;Small sips/bites;Check for pocketing;Check for anterior loss;Multiple dry swallows after each bite/sip;Follow solids with liquid;Effortful swallow Postural Changes and/or Swallow Maneuvers: Seated upright 90 degrees;Upright 30-60 min after meal              Oral Care Recommendations: Oral care BID;Staff/trained caregiver to provide oral care Follow up Recommendations: Skilled Nursing facility Plan: Continue with current plan of care   Thank you,  Genene Churn, [place]      [place] 10/14/2014, 4:39 PM

## 2014-10-14 NOTE — Progress Notes (Signed)
[facility] PROGRESS NOTE  Tara Welch JJO:[id number] DOB: November 24, 1921 DOA: 10/11/2014 PCP: No primary care provider on file.  Assessment/Plan: 1. CVA- patient has dense left hemiplegia with left sided neglect, from subacute acute right MCA territory infarct. She was made DO NOT RESUSCITATE. Neurology input appreciated. MRI brain showed persistent cerebral edema, no worse than prior imaging. Carotid dopplers do not show any significant stenosis bilaterally. Echocardiogram has been ordered. Due to the severity of her symptoms and lack of significant improvement, will consult palliative care for goals of care. 2. Dysphagia related to stroke. Speech therapy has seen the patient and has recommended NPO for now. They will hopefully re assess her today. I have discussed the option of PEG feeding vs. Comfort feeding. I do not feel that the patient truly understands the severity of her illness and neurologic deficits. Will ask palliative care to help address further goals of care. 3. Elevated troponin- likely demand ischemia in the setting of large stroke. Continue aspirin. Discussed with cardiology and patient is not felt to be a candidate for aggressive intervention.  4. Bradycardia. Patient had developed significant bradycardia with heart rate occasionally dipping down into the 30's. Bradycardia can often occur after patient's have suffered a large stroke. Discussed with cardiology and recommendations were to avoid further beta blockers. Review of home medications indicate that she takes Toprol 50mg  daily. Will continue to monitor heart rate and give low doses of beta blockers very cautiously as needed. TSH normal.  5. DVT prophylaxis- heparin  Code Status: DO NOT RESUSCITATE Family Communication: No family at bedside Disposition Plan: Remains inpatient   Consultants:  Neurology  Procedures:  None  Antibiotics:  None  HPI/Subjective: 81 year old female brought to the hospital with  altered mental status and left-sided weakness, CT head showed large subacute CVA in the MCA territory. Mild elevation of troponin 1.95.  Patient denies any new complaints today. Does not acknowledge weakness on left side, no shortness of breath  Objective: Filed Vitals:   10/14/14 0739  BP:   Pulse:   Temp: 98.2 F (36.8 C)  Resp:     Intake/Output Summary (Last 24 hours) at 10/14/14 0920 Last data filed at 10/14/14 0500  Gross per 24 hour  Intake   1050 ml  Output    550 ml  Net    500 ml   Filed Weights   10/12/14 0500 10/12/14 2000 10/14/14 0500  Weight: 55.021 kg (121 lb 4.8 oz) 55.4 kg (122 lb 2.2 oz) 56.2 kg (123 lb 14.4 oz)    Exam:   General:  Appears in no acute distress  Cardiovascular: S1-S2 regular  Respiratory: Clear to auscultation bilaterally  Abdomen: Soft, nontender, no organomegaly  Neurological- hemiplegia on left   Data Reviewed: Basic Metabolic Panel:  Recent Labs Lab 10/11/14 0300 10/13/14 1051 10/14/14 0532  NA 133* 137 137  K 3.8 3.2* 3.1*  CL 98* 105 105  CO2 23 23 23   GLUCOSE 140* 138* 140*  BUN 43* 34* 29*  CREATININE 0.98 0.73 0.67  CALCIUM 8.8* 8.2* 8.0*   Liver Function Tests:  Recent Labs Lab 10/11/14 0300  AST 53*  ALT 95*  ALKPHOS 56  BILITOT 1.5*  PROT 6.9  ALBUMIN 3.7   No results for input(s): LIPASE, AMYLASE in the last 168 hours. No results for input(s): AMMONIA in the last 168 hours. CBC:  Recent Labs Lab 10/11/14 0300 10/13/14 1051 10/14/14 0532  WBC 11.1* 7.3 7.5  NEUTROABS 9.9*  --   --  HGB 12.5 11.0* 11.0*  HCT 37.5 32.9* 33.3*  MCV 94.2 95.4 96.0  PLT 208 184 185   Cardiac Enzymes:  Recent Labs Lab 10/11/14 0300 10/12/14 1814  CKTOTAL 541*  --   TROPONINI  --  0.95*    CBG:  Recent Labs Lab 10/13/14 1608 10/13/14 1944 10/14/14 0017 10/14/14 0347 10/14/14 0723  GLUCAP 127* 156* 151* 125* 133*    Recent Results (from the past 240 hour(s))  MRSA PCR Screening      Status: None   Collection Time: 10/12/14  7:20 PM  Result Value Ref Range Status   MRSA by PCR NEGATIVE NEGATIVE Final    Comment:        The GeneXpert MRSA Assay (FDA approved for NASAL specimens only), is one component of a comprehensive MRSA colonization surveillance program. It is not intended to diagnose MRSA infection nor to guide or monitor treatment for MRSA infections.      Studies: Dg Chest 1 View  10/13/2014   CLINICAL DATA:  Post breast reconstruction surgery, MRI clearance  EXAM: CHEST  1 VIEW  COMPARISON:  None  FINDINGS: Normal heart size, mediastinal contours and pulmonary vascularity.  Abnormal soft tissue density projects over the lower trachea just above the carina, uncertain etiology, uncertain if related to the lung, mediastinum or superimposed soft tissue.  Deformity of the RIGHT breast from prior reconstruction with prosthesis.  No metallic foreign bodies identified.  Lungs clear.  No infiltrate, pleural effusion or pneumothorax.  Bones demineralized.  IMPRESSION: Prior RIGHT breast reconstructive surgery with prosthesis.  No metallic foreign bodies identified.  Questionable nodular density/abnormal soft tissue projecting over the inferior trachea, uncertain etiology; followup CT imaging of the chest with contrast recommended to assess.   Electronically Signed   By: Lavonia Dana M.D.   On: 10/13/2014 11:49   Mr Brain Wo Contrast  10/13/2014   ADDENDUM REPORT: 10/13/2014 13:00  ADDENDUM: Study discussed by telephone with Dr. Jolaine Artist MEMON on 10/13/2014 at 1251 hrs.   Electronically Signed   By: Genevie Ann M.D.   On: 10/13/2014 13:00   10/13/2014   CLINICAL DATA:  81 year old female with altered mental status, not responding. Stroke symptoms on 10/11/2014. Initial encounter.  EXAM: MRI HEAD WITHOUT CONTRAST  TECHNIQUE: Multiplanar, multiecho pulse sequences of the brain and surrounding structures were obtained without intravenous contrast.  COMPARISON:  Head CT without contrast  10/11/2014.  FINDINGS: Large area of intensely restricted diffusion involving the majority of the right MCA territory. The right basal ganglia are relatively spared. Confluent cytotoxic edema with T2 and FLAIR hyperintensity. No hemorrhagic transformation identified. Mass effect on the right lateral ventricle without midline shift or ventriculomegaly.  Small 5-6 mm focus of restricted diffusion in the left superior frontal gyrus (series 100, image 37). No other contralateral restricted diffusion, and no posterior fossa restriction.  Major intracranial vascular flow voids are preserved.  Basilar cisterns are patent. No acute intracranial hemorrhage identified. Superimposed on the acute findings, mild to moderate for age patchy in scattered nonspecific cerebral white matter T2 and FLAIR hyperintensity. Deep gray matter nuclei, brainstem and cerebellum are within normal limits for age. Visible internal auditory structures appear normal. Visualized paranasal sinuses and mastoids are clear. Postoperative changes to the globes. Negative pituitary, cervicomedullary junction and visualized cervical spine. Normal bone marrow signal. Visualized scalp soft tissues are within normal limits.  IMPRESSION: 1. Large right MCA acute infarct with cytotoxic edema but no hemorrhage. Relatively mild mass effect at this time, with  partial effacement of the right lateral ventricle but no midline shift. No ventriculomegaly. 2. Noncontrast head CT can be used for surveillance of intracranial mass effect. 3. Small lacunar type infarct also in the left superior frontal gyrus, favor synchronous small vessel disease.  Electronically Signed: By: Genevie Ann M.D. On: 10/13/2014 12:20   US Carotid Bilateral  10/13/2014   CLINICAL DATA:  Stroke.  Previous tobacco abuse.  EXAM: BILATERAL CAROTID DUPLEX ULTRASOUND  TECHNIQUE: Pearline Cables scale imaging, color Doppler and duplex ultrasound was performed of bilateral carotid and vertebral arteries in the neck.   COMPARISON:  None.  REVIEW OF SYSTEMS: Quantification of carotid stenosis is based on velocity parameters that correlate the residual internal carotid diameter with NASCET-based stenosis levels, using the diameter of the distal internal carotid lumen as the denominator for stenosis measurement.  The following velocity measurements were obtained:  PEAK SYSTOLIC/END DIASTOLIC  RIGHT  ICA:                     34/11cm/sec  CCA:                     34/1PF/XTK  SYSTOLIC ICA/CCA RATIO:  0.7  DIASTOLIC ICA/CCA RATIO: 2.1  ECA:                     44cm/sec  LEFT  ICA:                     61/16cm/sec  CCA:                     24/09BD/ZHG  SYSTOLIC ICA/CCA RATIO:  1.4  DIASTOLIC ICA/CCA RATIO: 1.7  ECA:                     34cm/sec  FINDINGS: RIGHT CAROTID ARTERY: No significant plaque accumulation or stenosis. Normal waveforms and color Doppler signal.  RIGHT VERTEBRAL ARTERY:  Normal flow direction and waveform.  LEFT CAROTID ARTERY: Mild intimal thickening. No significant plaque or stenosis. Normal waveforms and color Doppler signal.  LEFT VERTEBRAL ARTERY: Normal flow direction and waveform.  IMPRESSION: 1. No significant carotid plaque or stenosis.   Electronically Signed   By: Lucrezia Europe M.D.   On: 10/13/2014 14:39    Scheduled Meds: . antiseptic oral rinse  7 mL Mouth Rinse q12n4p  . aspirin  300 mg Rectal Daily  . chlorhexidine  15 mL Mouth Rinse BID  . heparin  5,000 Units Subcutaneous 3 times per day   Continuous Infusions: . dextrose 5 % and 0.45% NaCl 75 mL/hr at 10/13/14 2000    Principal Problem:   Acute CVA (cerebrovascular accident)    Time spent: 25 min    [facility] Pager [phone]. If 7PM-7AM, please contact night-coverage at [email or web address], password [facility] 10/14/2014, 9:20 AM  LOS: 3 days

## 2014-10-14 NOTE — Progress Notes (Signed)
Patient ID: Tara Welch, female   DOB: [birth year], 81 y.o.   MRN: [id number]  Tara A. Merlene Laughter, MD     [email or web address]          Tara Welch is an 81 y.o. female.   Assessment/Plan: 1. 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 Somewhat fluctuating level of consciousness but imaging does not show evidence of massive edema. Again, suspect that she should do okay from a stroke but secondary complications of sepsis, swallowing, aspiration pneumonia and cardiac arrhythmias are concerning.  2. Bradycardia dysrhythmia likely due to large cortical infarct.   No complaints. On Purred diet.    GENERAL: She is in no acute distress.  HEENT: Supple. Atraumatic normocephalic.   ABDOMEN: soft  EXTREMITIES: No edema   BACK: Normal.  SKIN: Normal by inspection.   MENTAL STATUS: She is awake and alert. She knows the season the hospital at [facility]. She states the year is 2016 and the month as May. Her age is stated as 69. There is moderate dysarthria. She follows commands well especially on the right side. There is dense visual and hemibody neglect on the left side.  CRANIAL NERVES: Pupils are equal, round and reactive to light and accommodation; extra ocular movements are full, there is no significant nystagmus; visual fields shows a dense left homonymous hemianopia; upper and lower facial muscles are normal in strength and symmetric, there is mild flattening of the nasolabial fold-L; tongue is midline;   MOTOR: Normal tone, bulk and strength; no pronator drift-R. Left hemiplegia.  COORDINATION: No dysmetria is no tremors.  REFLEXES: Deep tendon reflexes are symmetrical and normal. Babinski reflexes are flexor bilaterally.   SENSATION: Reduced sensation to light touch and temperature left side.       Objective: Vital signs in last 24 hours: Temp:  [97.5 F (36.4 C)-98.3 F (36.8 C)] 98.1 F (36.7 C) (06/02 1600) Pulse Rate:   [53-70] 68 (06/02 1600) Resp:  [16-22] 18 (06/02 1600) BP: (120-165)/(51-80) 154/77 mmHg (06/02 1600) SpO2:  [74 %-99 %] 97 % (06/02 1600) FiO2 (%):  [28 %] 28 % (06/01 2000) Weight:  [56.2 kg (123 lb 14.4 oz)] 56.2 kg (123 lb 14.4 oz) (06/02 0500)  Intake/Output from previous day: 06/01 0701 - 06/02 0700 In: 1125 [I.V.:1125] Out: 550 [Urine:550] Intake/Output this shift: Total I/O In: 150 [I.V.:150] Out: -  Nutritional status: Diet NPO time specified   Lab Results: Results for orders placed or performed during the hospital encounter of 10/11/14 (from the past 48 hour(s))  Troponin I     Status: Abnormal   Collection Time: 10/12/14  6:14 PM  Result Value Ref Range   Troponin I 0.95 (HH) <0.031 ng/mL    Comment: RESULT REPEATED AND VERIFIED CRITICAL RESULT CALLED TO, READ BACK BY AND VERIFIED WITH: LEE,B AT 1930 ON 10/12/2014 BY ISLEY,B        POSSIBLE MYOCARDIAL ISCHEMIA. SERIAL TESTING RECOMMENDED.   MRSA PCR Screening     Status: None   Collection Time: 10/12/14  7:20 PM  Result Value Ref Range   MRSA by PCR NEGATIVE NEGATIVE    Comment:        The GeneXpert MRSA Assay (FDA approved for NASAL specimens only), is one component of a comprehensive MRSA colonization surveillance program. It is not intended to diagnose MRSA infection nor to guide or monitor treatment for MRSA infections.   Glucose, capillary     Status: None   Collection Time: 10/12/14  7:48 PM  Result Value Ref Range   Glucose-Capillary 88 65 - 99 mg/dL  Glucose, capillary     Status: Abnormal   Collection Time: 10/13/14 12:09 AM  Result Value Ref Range   Glucose-Capillary 107 (H) 65 - 99 mg/dL  Glucose, capillary     Status: Abnormal   Collection Time: 10/13/14  4:45 AM  Result Value Ref Range   Glucose-Capillary 132 (H) 65 - 99 mg/dL  Glucose, capillary     Status: Abnormal   Collection Time: 10/13/14  7:39 AM  Result Value Ref Range   Glucose-Capillary 122 (H) 65 - 99 mg/dL   Comment 1  Notify RN    Comment 2 Document in Chart   CBC     Status: Abnormal   Collection Time: 10/13/14 10:51 AM  Result Value Ref Range   WBC 7.3 4.0 - 10.5 K/uL   RBC 3.45 (L) 3.87 - 5.11 MIL/uL   Hemoglobin 11.0 (L) 12.0 - 15.0 g/dL   HCT 32.9 (L) 36.0 - 46.0 %   MCV 95.4 78.0 - 100.0 fL   MCH 31.9 26.0 - 34.0 pg   MCHC 33.4 30.0 - 36.0 g/dL   RDW 13.9 11.5 - 15.5 %   Platelets 184 150 - 400 K/uL  Basic metabolic panel     Status: Abnormal   Collection Time: 10/13/14 10:51 AM  Result Value Ref Range   Sodium 137 135 - 145 mmol/L   Potassium 3.2 (L) 3.5 - 5.1 mmol/L   Chloride 105 101 - 111 mmol/L   CO2 23 22 - 32 mmol/L   Glucose, Bld 138 (H) 65 - 99 mg/dL   BUN 34 (H) 6 - 20 mg/dL   Creatinine, Ser 0.73 0.44 - 1.00 mg/dL   Calcium 8.2 (L) 8.9 - 10.3 mg/dL   GFR calc non Af Amer >60 >60 mL/min   GFR calc Af Amer >60 >60 mL/min    Comment: (NOTE) The eGFR has been calculated using the CKD EPI equation. This calculation has not been validated in all clinical situations. eGFR's persistently <60 mL/min signify possible Chronic Kidney Disease.    Anion gap 9 5 - 15  TSH     Status: None   Collection Time: 10/13/14 10:58 AM  Result Value Ref Range   TSH 2.784 0.350 - 4.500 uIU/mL  Glucose, capillary     Status: Abnormal   Collection Time: 10/13/14  4:08 PM  Result Value Ref Range   Glucose-Capillary 127 (H) 65 - 99 mg/dL   Comment 1 Notify RN    Comment 2 Document in Chart   Glucose, capillary     Status: Abnormal   Collection Time: 10/13/14  7:44 PM  Result Value Ref Range   Glucose-Capillary 156 (H) 65 - 99 mg/dL   Comment 1 Notify RN   Glucose, capillary     Status: Abnormal   Collection Time: 10/14/14 12:17 AM  Result Value Ref Range   Glucose-Capillary 151 (H) 65 - 99 mg/dL   Comment 1 Notify RN   Glucose, capillary     Status: Abnormal   Collection Time: 10/14/14  3:47 AM  Result Value Ref Range   Glucose-Capillary 125 (H) 65 - 99 mg/dL   Comment 1 Notify RN     Basic metabolic panel     Status: Abnormal   Collection Time: 10/14/14  5:32 AM  Result Value Ref Range   Sodium 137 135 - 145 mmol/L   Potassium 3.1 (L) 3.5 - 5.1  mmol/L   Chloride 105 101 - 111 mmol/L   CO2 23 22 - 32 mmol/L   Glucose, Bld 140 (H) 65 - 99 mg/dL   BUN 29 (H) 6 - 20 mg/dL   Creatinine, Ser 0.67 0.44 - 1.00 mg/dL   Calcium 8.0 (L) 8.9 - 10.3 mg/dL   GFR calc non Af Amer >60 >60 mL/min   GFR calc Af Amer >60 >60 mL/min    Comment: (NOTE) The eGFR has been calculated using the CKD EPI equation. This calculation has not been validated in all clinical situations. eGFR's persistently <60 mL/min signify possible Chronic Kidney Disease.    Anion gap 9 5 - 15  CBC     Status: Abnormal   Collection Time: 10/14/14  5:32 AM  Result Value Ref Range   WBC 7.5 4.0 - 10.5 K/uL   RBC 3.47 (L) 3.87 - 5.11 MIL/uL   Hemoglobin 11.0 (L) 12.0 - 15.0 g/dL   HCT 33.3 (L) 36.0 - 46.0 %   MCV 96.0 78.0 - 100.0 fL   MCH 31.7 26.0 - 34.0 pg   MCHC 33.0 30.0 - 36.0 g/dL   RDW 13.9 11.5 - 15.5 %   Platelets 185 150 - 400 K/uL  Glucose, capillary     Status: Abnormal   Collection Time: 10/14/14  7:23 AM  Result Value Ref Range   Glucose-Capillary 133 (H) 65 - 99 mg/dL   Comment 1 Notify RN    Comment 2 Document in Chart   Glucose, capillary     Status: Abnormal   Collection Time: 10/14/14 11:25 AM  Result Value Ref Range   Glucose-Capillary 104 (H) 65 - 99 mg/dL   Comment 1 Notify RN    Comment 2 Document in Chart     Lipid Panel  Recent Labs  10/12/14 0648  CHOL 120  TRIG 78  HDL 33*  CHOLHDL 3.6  VLDL 16  LDLCALC 71    Studies/Results: BRAIN MRI/MRA 1. Large right MCA acute infarct with cytotoxic edema but no hemorrhage. Relatively mild mass effect at this time, with partial effacement of the right lateral ventricle but no midline shift. No ventriculomegaly. 2. Noncontrast head CT can be used for surveillance of intracranial mass effect. 3. Small lacunar type  infarct also in the left superior frontal gyrus, favor synchronous small vessel disease.  CAROTID DOPPLERS NORMAL    Medications:  Scheduled Meds: . antiseptic oral rinse  7 mL Mouth Rinse q12n4p  . aspirin  300 mg Rectal Daily  . chlorhexidine  15 mL Mouth Rinse BID  . heparin  5,000 Units Subcutaneous 3 times per day  . potassium chloride  10 mEq Intravenous Q1 Hr x 6   Continuous Infusions: . dextrose 5 % and 0.45 % NaCl with KCl 20 mEq/L 75 mL/hr at 10/14/14 1031   PRN Meds:.     LOS: 3 days   Shiven Junious A. Merlene Welch, M.D.  Diplomate, Tax adviser of Psychiatry and Neurology ( Neurology).

## 2014-10-15 ENCOUNTER — Encounter (HOSPITAL_COMMUNITY): Payer: Self-pay | Admitting: Primary Care

## 2014-10-15 DIAGNOSIS — Z515 Encounter for palliative care: Secondary | ICD-10-CM | POA: Insufficient documentation

## 2014-10-15 DIAGNOSIS — I639 Cerebral infarction, unspecified: Secondary | ICD-10-CM

## 2014-10-15 LAB — BASIC METABOLIC PANEL
Anion gap: 8 (ref 5–15)
BUN: 20 mg/dL (ref 6–20)
CALCIUM: 7.7 mg/dL — AB (ref 8.9–10.3)
CHLORIDE: 103 mmol/L (ref 101–111)
CO2: 21 mmol/L — ABNORMAL LOW (ref 22–32)
Creatinine, Ser: 0.57 mg/dL (ref 0.44–1.00)
GFR calc non Af Amer: >60 mL/min (ref >60)
Glucose, Bld: 134 mg/dL — ABNORMAL HIGH (ref 65–99)
POTASSIUM: 3.5 mmol/L (ref 3.5–5.1)
Sodium: 132 mmol/L — ABNORMAL LOW (ref 135–145)

## 2014-10-15 LAB — GLUCOSE, CAPILLARY
GLUCOSE-CAPILLARY: 121 mg/dL — AB (ref 65–99)
Glucose-Capillary: 101 mg/dL — ABNORMAL HIGH (ref 65–99)
Glucose-Capillary: 107 mg/dL — ABNORMAL HIGH (ref 65–99)
Glucose-Capillary: 123 mg/dL — ABNORMAL HIGH (ref 65–99)
Glucose-Capillary: 142 mg/dL — ABNORMAL HIGH (ref 65–99)
Glucose-Capillary: 86 mg/dL (ref 65–99)

## 2014-10-15 MED ORDER — ASPIRIN 325 MG PO TABS
325.0000 mg | ORAL_TABLET | Freq: Every day | ORAL | Status: DC
  Administered 2014-10-16: 325 mg via ORAL
  Filled 2014-10-15: qty 1

## 2014-10-15 MED ORDER — ATORVASTATIN CALCIUM 10 MG PO TABS
10.0000 mg | ORAL_TABLET | Freq: Every day | ORAL | Status: DC
  Administered 2014-10-15: 10 mg via ORAL
  Filled 2014-10-15: qty 1

## 2014-10-15 MED ORDER — LEVOTHYROXINE SODIUM 50 MCG PO TABS
50.0000 ug | ORAL_TABLET | Freq: Every day | ORAL | Status: DC
  Administered 2014-10-16: 50 ug via ORAL
  Filled 2014-10-15: qty 1

## 2014-10-15 NOTE — Clinical Social Work Note (Signed)
CSW spoke with Mr. Killough, who was at patient's bedside.  Mr. Bousquet was agreeable for patient to go to [facility]. Mr. Rikard advised that once patient finished rehab, he would be taking her to a [facility] in [place] where she would go to a nursing home.  He stated that he does not like [place] due to the government. Mr. Warnick indicated that he has people in the [place]  Who are assisting him. He also indicated that he had a law suit against the stated for putting something in his blood and that [employer] and other county employees were at already jailed due to this.  CSW spoke with patient's niece, Annice Needy, and advised that she would need to obtain guardianship of patient as soon as possible, due to her son's delusions.  She advised that she would contact Oretha Caprice upon her arrival home as she was willing to assume health care guardianship/POA but did not want the financial piece. CSW provided patient with Tresa Garter office number. Ms. Wynonia Lawman advised that she knew Ms. Noralee Stain personally and would contact her via cell in about 20 minutes.   Ihor Gully, [place]

## 2014-10-15 NOTE — Care Management Note (Signed)
Case Management Note  Patient Details  Name: Tara Welch MRN: [id number] Date of Birth: 11-20-1921  Subjective/Objective:                    Action/Plan:   Expected Discharge Date:                  Expected Discharge Plan:  Skilled Nursing Facility  In-House Referral:  Clinical Social Work  Discharge planning Services  CM Consult  Post Acute Care Choice:  NA Choice offered to:  NA  DME Arranged:    DME Agency:     HH Arranged:    [place] Agency:     Status of Service:  Completed, signed off  Medicare Important Message Given:  Yes Date Medicare IM Given:  10/15/14 Medicare IM give by:  Christinia Gully, RN BSN CM Date Additional Medicare IM Given:    Additional Medicare Important Message give by:     If discussed at [place] of Stay Meetings, dates discussed:    Additional Comments: PT is recommending SNF.CSW is aware and will start bed search. Christinia Gully [place], RN 10/15/2014, 10:28 AM

## 2014-10-15 NOTE — Progress Notes (Signed)
Speech Language Pathology Treatment: Dysphagia  Patient Details Name: SUMMIT BORCHARDT MRN: [id number] DOB: 10-04-1921 Today's Date: 10/15/2014 Time: 9179-1505 SLP Time Calculation (min) (ACUTE ONLY): 24 min  Assessment / Plan / Recommendation Clinical Impression  Pt sleeping upon clinician entrance, moderate cueing required for patient to awaken and sustain attention for feeding tasks. Pt exhibited poor management of secretions with L sided drooling consistent throughout session. Oral stage of swallowing presenting moderate to severe deficits. Left sided pocketing, decreased bolus cohesion, weak lingual manipulation, and oral holding of bolus present. Pharyngeally suspect delayed swallow initiation. Pt also with poor awareness of POs and decreased insight. Coughing x1 noted following puree by spoon. Compensatory swallow strategies including alternation of liquids and solids showing mild improvements. Pt with pallative care consult this am. Objective swallow study indicated if family wishes to continue treatment options. Will reasses following pallative consult, continue current POC.      HPI Other Pertinent Information: Ms. Alliyah Roesler was brought to the hospital with AMS and left hemiplegia, from subacute acute right MCA territory infarct,admitting physician Dr. Truman Hayward had called patient's son and explained that patient could get worse in 1-2 days. She was made DO NOT RESUSCITATE. Neurology was consulted, consult is still pending. Neurology to see tonight. No further workup including MRI echo or carotid was ordered by the admitting physician. Will await neurology recommendations regarding further workup. Continue aspirin. Patient has mild elevation of troponin 1.95, no chest pain but has multiple PVCs on telemetry. Pt failed RN swallow screen and clinical swallow evaluation ordered as part of stroke protocol. Pt lives at home with her son, but was reportedly very active PTA.   Pertinent Vitals Pain Assessment:  No/denies pain  SLP Plan  Continue with current plan of care    Recommendations Diet recommendations: Dysphagia 1 (puree);Honey-thick liquid Liquids provided via: Cup;Teaspoon Medication Administration: Crushed with puree Supervision: Staff to assist with self feeding;Full supervision/cueing for compensatory strategies;Trained caregiver to feed patient Compensations: Externally pace;Minimize environmental distractions;Slow rate;Small sips/bites;Check for pocketing;Check for anterior loss;Multiple dry swallows after each bite/sip;Follow solids with liquid;Effortful swallow Postural Changes and/or Swallow Maneuvers: Seated upright 90 degrees;Upright 30-60 min after meal              Oral Care Recommendations: Oral care BID;Staff/trained caregiver to provide oral care Plan: Continue with current plan of care    [place] MA, [place] Language Pathologist     Levi Aland 10/15/2014, 8:16 AM

## 2014-10-15 NOTE — Care Management (Signed)
Contacted by Lenna Sciara from [facility]. They have been contacted by pt's son who is threatening to sing pt out AMA. DSS is working to get emergent guardianship of pt. MD made aware.

## 2014-10-15 NOTE — Progress Notes (Signed)
Patient had 4.9 second pause according to telemetry.   Notified MD. Patient stable.  No new orders received at this time.  Patient stable.  Will continue to monitor patient.

## 2014-10-15 NOTE — Progress Notes (Signed)
Patient ID: Tara Welch, female   DOB: 01/04/1922, 81 y.o.   MRN: [id number]  Tara A. Merlene Laughter, MD     [email or web address]          Tara Welch is an 81 y.o. female.   Assessment/Plan: 1. 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 Somewhat fluctuating level of consciousness but imaging does not show evidence of massive edema. Again, suspect that she should do okay from a stroke but secondary complications of sepsis, swallowing, aspiration pneumonia and cardiac arrhythmias are concerning.  2. Bradycardia dysrhythmia likely due to large cortical infarct.   No complaints. Son Richardson Landry at bedside. On Purred diet.    GENERAL: She is in no acute distress.  HEENT: Supple. Atraumatic normocephalic.   ABDOMEN: soft  EXTREMITIES: No edema   BACK: Normal.  SKIN: Normal by inspection.   MENTAL STATUS: She is awake and alert. She knows the season the hospital at [facility].There is moderate dysarthria. She follows commands well especially on the right side. There is dense visual and hemibody neglect on the left side.  CRANIAL NERVES: Pupils are equal, round and reactive to light and accommodation; extra ocular movements are full, there is no significant nystagmus; visual fields shows a dense left homonymous hemianopia; upper and lower facial muscles are normal in strength and symmetric, there is mild flattening of the nasolabial fold-L; tongue is midline; R gaze preferrance   MOTOR: Normal tone, bulk and strength; no pronator drift-R. Left hemiplegia.  COORDINATION: No dysmetria is no tremors.        Objective: Vital signs in last 24 hours: Temp:  [97.6 F (36.4 C)-98.5 F (36.9 C)] 98.2 F (36.8 C) (06/03 2054) Pulse Rate:  [56-75] 75 (06/03 2054) Resp:  [17-20] 20 (06/03 2054) BP: (125-140)/(41-56) 140/56 mmHg (06/03 2054) SpO2:  [97 %-99 %] 99 % (06/03 2054)  Intake/Output from previous day: 06/02 0701 - 06/03 0700 In: 1311.3  [I.V.:711.3; IV Piggyback:600] Out: 200 [Urine:200] Intake/Output this shift:   Nutritional status: DIET - DYS 1 Room service appropriate?: Yes; Fluid consistency:: Honey Thick   Lab Results: Results for orders placed or performed during the hospital encounter of 10/11/14 (from the past 48 hour(s))  Glucose, capillary     Status: Abnormal   Collection Time: 10/14/14 12:17 AM  Result Value Ref Range   Glucose-Capillary 151 (H) 65 - 99 mg/dL   Comment 1 Notify RN   Glucose, capillary     Status: Abnormal   Collection Time: 10/14/14  3:47 AM  Result Value Ref Range   Glucose-Capillary 125 (H) 65 - 99 mg/dL   Comment 1 Notify RN   Basic metabolic panel     Status: Abnormal   Collection Time: 10/14/14  5:32 AM  Result Value Ref Range   Sodium 137 135 - 145 mmol/L   Potassium 3.1 (L) 3.5 - 5.1 mmol/L   Chloride 105 101 - 111 mmol/L   CO2 23 22 - 32 mmol/L   Glucose, Bld 140 (H) 65 - 99 mg/dL   BUN 29 (H) 6 - 20 mg/dL   Creatinine, Ser 0.67 0.44 - 1.00 mg/dL   Calcium 8.0 (L) 8.9 - 10.3 mg/dL   GFR calc non Af Amer >60 >60 mL/min   GFR calc Af Amer >60 >60 mL/min    Comment: (NOTE) The eGFR has been calculated using the CKD EPI equation. This calculation has not been validated in all clinical situations. eGFR's persistently <60 mL/min signify possible Chronic Kidney  Disease.    Anion gap 9 5 - 15  CBC     Status: Abnormal   Collection Time: 10/14/14  5:32 AM  Result Value Ref Range   WBC 7.5 4.0 - 10.5 K/uL   RBC 3.47 (L) 3.87 - 5.11 MIL/uL   Hemoglobin 11.0 (L) 12.0 - 15.0 g/dL   HCT 33.3 (L) 36.0 - 46.0 %   MCV 96.0 78.0 - 100.0 fL   MCH 31.7 26.0 - 34.0 pg   MCHC 33.0 30.0 - 36.0 g/dL   RDW 13.9 11.5 - 15.5 %   Platelets 185 150 - 400 K/uL  Glucose, capillary     Status: Abnormal   Collection Time: 10/14/14  7:23 AM  Result Value Ref Range   Glucose-Capillary 133 (H) 65 - 99 mg/dL   Comment 1 Notify RN    Comment 2 Document in Chart   Glucose, capillary      Status: Abnormal   Collection Time: 10/14/14 11:25 AM  Result Value Ref Range   Glucose-Capillary 104 (H) 65 - 99 mg/dL   Comment 1 Notify RN    Comment 2 Document in Chart   Glucose, capillary     Status: Abnormal   Collection Time: 10/14/14  4:37 PM  Result Value Ref Range   Glucose-Capillary 121 (H) 65 - 99 mg/dL   Comment 1 Notify RN    Comment 2 Document in Chart   Glucose, capillary     Status: Abnormal   Collection Time: 10/14/14  7:50 PM  Result Value Ref Range   Glucose-Capillary 116 (H) 65 - 99 mg/dL  Glucose, capillary     Status: Abnormal   Collection Time: 10/14/14 11:58 PM  Result Value Ref Range   Glucose-Capillary 107 (H) 65 - 99 mg/dL   Comment 1 Notify RN    Comment 2 Document in Chart   Glucose, capillary     Status: Abnormal   Collection Time: 10/15/14  4:31 AM  Result Value Ref Range   Glucose-Capillary 123 (H) 65 - 99 mg/dL   Comment 1 Notify RN    Comment 2 Document in Chart   Basic metabolic panel     Status: Abnormal   Collection Time: 10/15/14  5:25 AM  Result Value Ref Range   Sodium 132 (L) 135 - 145 mmol/L   Potassium 3.5 3.5 - 5.1 mmol/L   Chloride 103 101 - 111 mmol/L   CO2 21 (L) 22 - 32 mmol/L   Glucose, Bld 134 (H) 65 - 99 mg/dL   BUN 20 6 - 20 mg/dL   Creatinine, Ser 0.57 0.44 - 1.00 mg/dL   Calcium 7.7 (L) 8.9 - 10.3 mg/dL   GFR calc non Af Amer >60 >60 mL/min   GFR calc Af Amer >60 >60 mL/min    Comment: (NOTE) The eGFR has been calculated using the CKD EPI equation. This calculation has not been validated in all clinical situations. eGFR's persistently <60 mL/min signify possible Chronic Kidney Disease.    Anion gap 8 5 - 15  Glucose, capillary     Status: Abnormal   Collection Time: 10/15/14  7:20 AM  Result Value Ref Range   Glucose-Capillary 121 (H) 65 - 99 mg/dL   Comment 1 Notify RN    Comment 2 Document in Chart   Glucose, capillary     Status: Abnormal   Collection Time: 10/15/14 11:05 AM  Result Value Ref Range    Glucose-Capillary 142 (H) 65 - 99 mg/dL  Comment 1 Notify RN    Comment 2 Document in Chart   Glucose, capillary     Status: Abnormal   Collection Time: 10/15/14  4:24 PM  Result Value Ref Range   Glucose-Capillary 101 (H) 65 - 99 mg/dL   Comment 1 Notify RN    Comment 2 Document in Chart   Glucose, capillary     Status: None   Collection Time: 10/15/14  9:00 PM  Result Value Ref Range   Glucose-Capillary 86 65 - 99 mg/dL   Comment 1 Notify RN    Comment 2 Document in Chart     Lipid Panel No results for input(s): CHOL, TRIG, HDL, CHOLHDL, VLDL, LDLCALC in the last 72 hours.  Studies/Results: BRAIN MRI/MRA 1. Large right MCA acute infarct with cytotoxic edema but no hemorrhage. Relatively mild mass effect at this time, with partial effacement of the right lateral ventricle but no midline shift. No ventriculomegaly. 2. Noncontrast head CT can be used for surveillance of intracranial mass effect. 3. Small lacunar type infarct also in the left superior frontal gyrus, favor synchronous small vessel disease.  CAROTID DOPPLERS NORMAL    Medications:  Scheduled Meds: . antiseptic oral rinse  7 mL Mouth Rinse q12n4p  . aspirin  325 mg Oral Daily  . atorvastatin  10 mg Oral q1800  . heparin  5,000 Units Subcutaneous 3 times per day  . [START ON 10/13/2014] levothyroxine  50 mcg Oral QAC breakfast   Continuous Infusions:   PRN Meds:.     LOS: 4 days   Piers Baade A. Merlene Welch, M.D.  Diplomate, Tax adviser of Psychiatry and Neurology ( Neurology).

## 2014-10-15 NOTE — Progress Notes (Signed)
Occupational Therapy Treatment Patient Details Name: Tara Welch MRN: [id number] DOB: [birth year] Today's Date: 10/15/2014    History of present illness Pt is a 81 year old female who lives with her son independently.  She sustained a right MCA territory stroke and now has dense left hemiparesis.   OT comments  Pt awake and drowsy this am, min difficulty engaging in treatment session. Pt sitting up in bed on OT arrival, as pt had recently eaten breakfast. Pt has slight increase in edema in LUE this morning as compared to yesterday's evaluation. Retrograde massage to digits and hand, and LUE PROM exercises performed to maintain joint mobility and manage edema.  Positioned pt's LUE on pillows to encourage redistribution of fluid and further assist in managing edema. Posted LUE positioning techniques specific for hemiplegia in pt's room for staff to follow.    Follow Up Recommendations  SNF;Supervision/Assistance - 24 hour          Precautions / Restrictions Precautions Precautions: Fall              ADL Overall ADL's : Needs assistance/impaired                                              Vision                 Additional Comments: Pt requires mod verbal cuing to look to the left and engage in activity on her left side          Cognition   Behavior During Therapy: WFL for tasks assessed/performed Overall Cognitive Status: Within Functional Limits for tasks assessed                         Exercises General Exercises - Upper Extremity Shoulder Flexion: PROM;10 reps Shoulder ABduction: PROM;10 reps Shoulder ADduction: PROM;10 reps Shoulder Horizontal ABduction: PROM;10 reps Shoulder Horizontal ADduction: PROM;10 reps Elbow Flexion: PROM;10 reps Elbow Extension: PROM;10 reps Wrist Flexion: PROM;10 reps Wrist Extension: PROM;10 reps Digit Composite Flexion: PROM;10 reps Composite Extension: PROM;10 reps           Pertinent Vitals/  Pain       Pain Assessment: No/denies pain         Frequency Min 2X/week     Progress Toward Goals  OT Goals(current goals can now be found in the care plan section)  Progress towards OT goals: Progressing toward goals  ADL Goals Pt Will Perform Grooming: bed level;with min assist Additional ADL Goal #1: Pt will attend to left side a minimum of 50% of the time using compensatory strategies and awareness techniques.  Additional ADL Goal #2: Pt will participate in PROM exercise program to maintain joint mobility and reduce risk of contractures in LUE.   Plan Discharge plan remains appropriate       End of Session     Activity Tolerance Patient tolerated treatment well   Patient Left in bed;with call bell/phone within reach;with bed alarm set           Time: 2585-2778 OT Time Calculation (min): 36 min  Charges: OT General Charges $OT Visit: 1 Procedure OT Treatments $Therapeutic Exercise: 23-37 mins  Guadelupe Sabin, OTR/L  [phone]  10/15/2014, 9:09 AM

## 2014-10-15 NOTE — Care Management (Signed)
Contacted by [place] social worker who states deputy is on his way to the hospital to serve pt with papers stating DSS now has guardianship (DSS still expects guardianship to be turned over to family in the new future). Emergency guardianship rendered due to threats from son to have pt removed from hospital AMA and take to "camp" in [place], son has been deemed incompetent to make decisions on mothers behalf. Melissa also states that paperwork verifying guardianship will be faxed to dept and should be placed with pt's documents. Pt's RN notified to be looking for those documents.

## 2014-10-15 NOTE — Progress Notes (Addendum)
Physical Therapy Treatment Patient Details Name: Tara Welch MRN: [id number] DOB: 1921-07-29 Today's Date: 10/15/2014    History of Present Illness Pt is a 81 year old female who lives with her son independently.  She sustained a right MCA territory stroke and now has dense left hemiparesis.    PT Comments    Pt very drowsy at the time of my visit.  Initially she was able to fully awaken and respond to directions but then quickly fell asleep.  She would awaken enough to verbally respond but could not open her eyes.  Today, she is able to turn her head to the left and appears to have some sense of her left side.  She was not able to demonstrate any active motion of the left leg.  She was able to roll to the left with min assist but needs total assist to roll right.  She was too drowsy to attempt to transfer to the chair.  Follow Up Recommendations     SNF   Equipment Recommendations    none   Recommendations for Other Services  OT ST     Precautions / Restrictions Precautions Precautions: Fall Restrictions Weight Bearing Restrictions: No    Mobility  Bed Mobility   Bed Mobility: Rolling Rolling: Total assist         General bed mobility comments: pt able to roll to the left with min assist but needs total assist rolling right...she is now able to turn her head to the left when hearing voices  Transfers                 General transfer comment: unable to transfer due to drowsiness  Ambulation/Gait     unable                                                                      Cognition Arousal/Alertness: Lethargic (extremely drowsy) Behavior During Therapy: WFL for tasks assessed/performed Overall Cognitive Status: Within Functional Limits for tasks assessed                      Exercises General Exercises - Upper Extremity Shoulder Flexion: PROM;10 reps Shoulder ABduction: PROM;10 reps Shoulder ADduction:  PROM;10 reps Shoulder Horizontal ABduction: PROM;10 reps Shoulder Horizontal ADduction: PROM;10 reps Elbow Flexion: PROM;10 reps Elbow Extension: PROM;10 reps Wrist Flexion: PROM;10 reps Wrist Extension: PROM;10 reps Digit Composite Flexion: PROM;10 reps Composite Extension: PROM;10 reps            Pertinent Vitals/Pain Pain Assessment: No/denies pain                                      PT Goals (current goals can now be found in the care plan section) Progress towards PT goals: Not progressing toward goals - comment (very drowsy)    Frequency       PT Plan Current plan remains appropriate                 End of Session   Activity Tolerance: Patient limited by fatigue Patient left: in bed;with call bell/phone within reach;with bed alarm set     Time: [phone]  PT Time Calculation (min) (ACUTE ONLY): 18 min  Charges:  $Therapeutic Activity: 8-22 mins                    G Codes:      Demetrios Isaacs L  PT 10/15/2014, 9:13 AM

## 2014-10-15 NOTE — Consult Note (Signed)
Consultation Note Date: 10/15/2014   Patient Name: Tara Welch  DOB: 1921/09/09  MRN: [id number]  Age / Sex: 81 y.o., female   PCP: No primary care provider on file. Referring Physician: Kathie Dike, MD  Reason for Consultation: Disposition, Establishing goals of care and Psychosocial/spiritual support  Palliative Care Assessment and Plan Summary of Established Goals of Care and Medical Treatment Preferences   81 yo woman admitted with large Right MCA stroke, complete left hemiparesis, left sided neglect and severe lethargy-PMT consulted for goals of care discussion and care coordination. Patient able to communicate her wishes at least on a basic level during our meeting today- but her full capacity is difficult to assess and her attention and concentration focus are clearly being affected by her stroke- poor awareness of her condition in general.  Code Status/Advance Care Planning:  DNR  Symptom Management:   Denies pain and suffering. +Fatigue, left hemiparesis  Palliative Prophylaxis: Bowel regimen  Comfort Feeding desire- needs maximal cueing  Psycho-social/Spiritual:   Support System: Poor, lives with son who has mental illness  Desire for further Chaplaincy support:yes  Prognosis: < 6 months  Discharge Planning:  [place] with Hospice- ideally she needs hospice care at discharge- I doubt she will be able to rehab- we can have palliative care follow her at [facility] from our team if she needs to use her rehab benefit first and transition into hospice care when appropriate- this really depends on her PO intake and mental status over the next few days/weeks. I am concerned about her son Steve's mental capacity- may need to pursue HCPOA or Guardianship possibilities if not APS notification.       Chief Complaint/History of Present Illness: Altered Mental Status  Primary Diagnoses  Present on Admission:  . Acute CVA (cerebrovascular accident) .  Dysphagia, pharyngoesophageal phase . Elevated troponin . Bradycardia  Palliative Review of Systems:  I have reviewed the medical record, interviewed the patient and family, and examined the patient. The following aspects are pertinent.  Past Medical History  Diagnosis Date  . Cancer     right breast  . Thyroid disease    History   Social History  . Marital Status: Widowed    Spouse Name: N/A  . Number of Children: N/A  . Years of Education: N/A   Social History Main Topics  . Smoking status: Former Research scientist (life sciences)  . Smokeless tobacco: Not on file  . Alcohol Use: No  . Drug Use: No  . Sexual Activity: Yes    Birth Control/ Protection: Post-menopausal   Other Topics Concern  . None   Social History Narrative  . None   History reviewed. No pertinent family history. Scheduled Meds: . antiseptic oral rinse  7 mL Mouth Rinse q12n4p  . aspirin  300 mg Rectal Daily  . heparin  5,000 Units Subcutaneous 3 times per day   Continuous Infusions: . dextrose 5 % and 0.45 % NaCl with KCl 20 mEq/L 75 mL/hr at 10/15/14 0918   PRN Meds:. Medications Prior to Admission:  Prior to Admission medications   Medication Sig Start Date End Date Taking? Authorizing Provider  metoprolol succinate (TOPROL-XL) 50 MG 24 hr tablet Take 1 tablet by mouth daily. 09/21/14  Yes Historical Provider, MD  SYNTHROID 50 MCG tablet Take 1 tablet by mouth daily. 09/21/14  Yes Historical Provider, MD   No Known Allergies CBC:    Component Value Date/Time   WBC 7.5 10/14/2014 0532   HGB 11.0* 10/14/2014  0532   HCT 33.3* 10/14/2014 0532   PLT 185 10/14/2014 0532   MCV 96.0 10/14/2014 0532   NEUTROABS 9.9* 10/11/2014 0300   LYMPHSABS 0.3* 10/11/2014 0300   MONOABS 0.9 10/11/2014 0300   EOSABS 0.0 10/11/2014 0300   BASOSABS 0.0 10/11/2014 0300   Comprehensive Metabolic Panel:    Component Value Date/Time   NA 132* 10/15/2014 0525   K 3.5 10/15/2014 0525   CL 103 10/15/2014 0525   CO2 21* 10/15/2014  0525   BUN 20 10/15/2014 0525   CREATININE 0.57 10/15/2014 0525   GLUCOSE 134* 10/15/2014 0525   CALCIUM 7.7* 10/15/2014 0525   AST 53* 10/11/2014 0300   ALT 95* 10/11/2014 0300   ALKPHOS 56 10/11/2014 0300   BILITOT 1.5* 10/11/2014 0300   PROT 6.9 10/11/2014 0300   ALBUMIN 3.7 10/11/2014 0300    Physical Exam: Vital Signs: BP 125/41 mmHg  Pulse 56  Temp(Src) 98.5 F (36.9 C) (Axillary)  Resp 18  Ht 5\' 8"  (1.727 m)  Wt 56.2 kg (123 lb 14.4 oz)  BMI 18.84 kg/m2  SpO2 97% SpO2: SpO2: 97 % O2 Device: O2 Device: Not Delivered O2 Flow Rate: O2 Flow Rate (L/min): 2 L/min Intake/output summary:  Intake/Output Summary (Last 24 hours) at 10/15/14 1119 Last data filed at 10/15/14 0800  Gross per 24 hour  Intake 1281.25 ml  Output    200 ml  Net 1081.25 ml   LBM:   Baseline Weight: Weight: 55.021 kg (121 lb 4.8 oz) Most recent weight: Weight: 56.2 kg (123 lb 14.4 oz)  Exam Findings:  20             Additional Data Reviewed: Recent Labs     10/13/14  1051  10/14/14  0532  10/15/14  0525  WBC  7.3  7.5   --   HGB  11.0*  11.0*   --   PLT  184  185   --   NA  137  137  132*  BUN  34*  29*  20  CREATININE  0.73  0.67  0.57   Time In: 330 Time Out: 415 Time Total: 45  Greater than 50%  of this time was spent counseling and coordinating care related to the above assessment and plan.  Signed by: Roma Schanz, DO  10/15/2014, 11:19 AM  Please contact Palliative Medicine Team phone at [phone] for questions and concerns.

## 2014-10-15 NOTE — Progress Notes (Signed)
Daily Progress Note   Patient Name: Tara Welch       Date: 10/15/2014 DOB: March 13, 1922  Age: 81 y.o. MRN#: [id number] Attending Physician: Kathie Dike, MD Primary Care Physician: No primary care provider on file. Admit Date: 10/11/2014  Reason for Consultation/Follow-up: Disposition, Establishing goals of care and Psychosocial/spiritual support  Subjective: Continued conversation with patient, and initial face to face meeting with her son, Richardson Landry, regarding the severity of Ms. Aguinaga's stroke and GOC.  Ms. Huffstetler continues to state that she will "get better'.  Both Ms. Repsher and her son are agreeable to rehab attempts.     She states that she has not make any healthcare advance directives, but does have a financial will.  This NP will continue tosupport patient, family and staff and holistically.  Ms. Mowers has been caregiver for Richardson Landry, who states that he feels that he can make his meals and provide groceries at this time.  Reassurance given to Ms Krieger that Steve's needs will be met by social services.     Interval Events: Ms. Albany has continued to remain lethargic but will open her eyes and interact with exam and discussions.  SW on site has worked for placement with [facility], and the admissions coordinator is coming to complete paperwork today with the goal of transfer 10/19/2014.  Due to son, Steve's psychiatric diagnosis, emergency guardianship is being sought.   Ms. Dockery has nieces who are agreeing to be healthcare surrogates for her.     Length of Stay: 4 days  Current Medications: Scheduled Meds:  . antiseptic oral rinse  7 mL Mouth Rinse q12n4p  . aspirin  325 mg Oral Daily  . atorvastatin  10 mg Oral q1800  . heparin  5,000 Units Subcutaneous 3 times per day  . [START ON 11/08/2014] levothyroxine  50 mcg Oral QAC breakfast     Vital Signs: BP 132/51 mmHg  Pulse 62  Temp(Src) 98.2 F (36.8 C) (Axillary)  Resp 18  Ht _0  (1.727 m)  Wt 56.2 kg (123 lb 14.4 oz)   BMI 18.84 kg/m2  SpO2 98% SpO2: SpO2: 98 % O2 Device: O2 Device: Not Delivered O2 Flow Rate: O2 Flow Rate (L/min): 2 L/min  Intake/output summary:  Intake/Output Summary (Last 24 hours) at 10/15/14 1519 Last data filed at 10/15/14 1200  Gross per 24 hour  Intake 1311.25 ml  Output    200 ml  Net 1111.25 ml   LBM:   Baseline Weight: Weight: 55.021 kg (121 lb 4.8 oz) Most recent weight: Weight: 56.2 kg (123 lb 14.4 oz)  Physical Exam: GEN:  Frail looking, lethargic  Neuro:   Left facial droop, left side neglect.  MUS: Flaccid left side.  RESP: even respirations, no cough or dyspnea.  GI:  abd soft, flat. GU: foley in place.             Additional Data Reviewed: Recent Labs     10/13/14  1051  10/14/14  0532  10/15/14  0525  WBC  7.3  7.5   --   HGB  11.0*  11.0*   --   PLT  184  185   --   NA  137  137  132*  BUN  34*  29*  20  CREATININE  0.73  0.67  0.57     Problem List:  Patient Active Problem List   Diagnosis Date Noted  . Dysphagia, pharyngoesophageal phase 10/14/2014  . Left hemiplegia 10/14/2014  .  Elevated troponin 10/14/2014  . Bradycardia 10/14/2014  . Acute CVA (cerebrovascular accident) 10/11/2014     Palliative Care Assessment & Plan    Code Status:  DNR  Goals of Care:  Ms Ledlow will be placed with [facility] for rehab, but she is likely eligible for in house hospice services.  Son, Richardson Landry,  became angry and left the room with discussion of probability of further decline and future goals of care.     4. Palliative Prophylaxis:  Stool Softner   5. Prognosis: Unable to determine, but likely poor.   5. Discharge Planning: Skilled nursing facility, [facility], with rehab. Emergency guardianship obtained today.   Discussion with Dr. Roderic Palau regarding today's meeting and outcomes.  Care plan was discussed with patient and son, Kynzley Dowson.   Thank you for allowing the Palliative Medicine Team to assist in the care of this  patient.   Time In: 1430 Time Out: 1530 Total Time 60 Prolonged Time Billed  no     Greater than 50%  of this time was spent counseling and coordinating care related to the above assessment and plan.   Drue Novel, NP  10/15/2014, 3:19 PM  Please contact Palliative Medicine Team phone at [phone] for questions and concerns.

## 2014-10-15 NOTE — Progress Notes (Signed)
[facility] PROGRESS NOTE  Tara Welch ZOX:[id number] DOB: [birth year] DOA: 10/11/2014 PCP: No primary care provider on file.  Assessment/Plan: 1. CVA- patient has dense left hemiplegia with left sided neglect, from subacute acute right MCA territory infarct. She was made DO NOT RESUSCITATE. Neurology input appreciated. MRI brain showed persistent cerebral edema, no worse than prior imaging. Carotid dopplers do not show any significant stenosis bilaterally. Echocardiogram unremarkable. She has been continued on a aspirin as well as started on a low-dose statin. Palliative care has met with the patient and family and it does not appear that they are ready/willing to accept hospice services. I suspect her long-term prognosis will be poor, but the patient and family wish to pursue physical therapy and are hopeful for recovery. We'll plan on skilled nursing facility placement. 2. Dysphagia related to stroke. Speech therapy has seen the patient and started her on . diet with honey thick liquid. She'll need continued speech therapy. 3. Elevated troponin- likely demand ischemia in the setting of large stroke. Continue aspirin. Discussed with cardiology and patient is not felt to be a candidate for aggressive intervention.  4. Bradycardia. Patient had developed significant bradycardia with heart rate occasionally dipping down into the 30's. Bradycardia can often occur after patient's have suffered a large stroke. Discussed with cardiology and recommendations were to avoid further beta blockers. Review of home medications indicate that she takes Toprol 29m daily. Will continue to monitor heart rate and give low doses of beta blockers very cautiously as needed. TSH normal.  5. DVT prophylaxis- heparin  Code Status: DO NOT RESUSCITATE Family Communication: Discussed with son at the bedside Disposition Plan: Remains inpatient   Consultants:  Neurology  Procedures:  Echo: - Left ventricle: The cavity  size was normal. Wall thickness was normal. Systolic function was vigorous. The estimated ejection fraction was in the range of 65% to 70%. Wall motion was normal; there were no regional wall motion abnormalities. Features are consistent with a pseudonormal left ventricular filling pattern, with concomitant abnormal relaxation and increased filling pressure (grade 2 diastolic dysfunction). Doppler parameters are consistent with high ventricular filling pressure. - Aortic valve: Moderately calcified annulus. Moderately thickened, severely calcified leaflets. There was moderate stenosis. There was moderate regurgitation. Peak velocity (S): 247 cm/s. Mean gradient (S): 12 mm Hg. Valve area (VTI): 1.01 cm^2. Valve area (Vmax): 0.98 cm^2. Valve area (Vmean): 0.96 cm^2. - Mitral valve: Severely calcified annulus. There was mild regurgitation. - Left atrium: The atrium was mildly to moderately dilated. Volume/bsa, ES, (1-plane Simpson&'s, A2C): 35.9 ml/m^2. - Right ventricle: Systolic function was moderately reduced. - Right atrium: The atrium was moderately dilated. - Atrial septum: No defect or patent foramen ovale was identified. - Tricuspid valve: There was moderate-severe regurgitation. - Inferior vena cava: The vessel was dilated. The respirophasic diameter changes were blunted (< 50%), consistent with elevated central venous pressure.  Antibiotics:  None  HPI/Subjective: 81year old female brought to the hospital with altered mental status and left-sided weakness, CT head showed large subacute CVA in the MCA territory. Mild elevation of troponin 1.95.  Patient knows she is in the hospital. She is aware that she has had a stroke. She is unaware of the date. She denies any complaints.  Objective: Filed Vitals:   10/15/14 0426  BP: 125/41  Pulse: 56  Temp: 98.5 F (36.9 C)  Resp: 18    Intake/Output Summary (Last 24 hours) at 10/15/14 1254 Last  data filed at 10/15/14 0800  Gross per 24 hour  Intake 1281.25 ml  Output    200 ml  Net 1081.25 ml   Filed Weights   10/12/14 0500 10/12/14 2000 10/14/14 0500  Weight: 55.021 kg (121 lb 4.8 oz) 55.4 kg (122 lb 2.2 oz) 56.2 kg (123 lb 14.4 oz)    Exam:   General:  NAD  Cardiovascular: S1-S2 RRR  Respiratory: CTA B  Abdomen: Soft, nontender, no organomegaly  Neurological- hemiplegia on left   Data Reviewed: Basic Metabolic Panel:  Recent Labs Lab 10/11/14 0300 10/13/14 1051 10/14/14 0532 10/15/14 0525  NA 133* 137 137 132*  K 3.8 3.2* 3.1* 3.5  CL 98* 105 105 103  CO2 23 23 23  21*  GLUCOSE 140* 138* 140* 134*  BUN 43* 34* 29* 20  CREATININE 0.98 0.73 0.67 0.57  CALCIUM 8.8* 8.2* 8.0* 7.7*   Liver Function Tests:  Recent Labs Lab 10/11/14 0300  AST 53*  ALT 95*  ALKPHOS 56  BILITOT 1.5*  PROT 6.9  ALBUMIN 3.7   No results for input(s): LIPASE, AMYLASE in the last 168 hours. No results for input(s): AMMONIA in the last 168 hours. CBC:  Recent Labs Lab 10/11/14 0300 10/13/14 1051 10/14/14 0532  WBC 11.1* 7.3 7.5  NEUTROABS 9.9*  --   --   HGB 12.5 11.0* 11.0*  HCT 37.5 32.9* 33.3*  MCV 94.2 95.4 96.0  PLT 208 184 185   Cardiac Enzymes:  Recent Labs Lab 10/11/14 0300 10/12/14 1814  CKTOTAL 541*  --   TROPONINI  --  0.95*    CBG:  Recent Labs Lab 10/14/14 1950 10/14/14 2358 10/15/14 0431 10/15/14 0720 10/15/14 1105  GLUCAP 116* 107* 123* 121* 142*    Recent Results (from the past 240 hour(s))  MRSA PCR Screening     Status: None   Collection Time: 10/12/14  7:20 PM  Result Value Ref Range Status   MRSA by PCR NEGATIVE NEGATIVE Final    Comment:        The GeneXpert MRSA Assay (FDA approved for NASAL specimens only), is one component of a comprehensive MRSA colonization surveillance program. It is not intended to diagnose MRSA infection nor to guide or monitor treatment for MRSA infections.      Studies: US  Carotid Bilateral  10/13/2014   CLINICAL DATA:  Stroke.  Previous tobacco abuse.  EXAM: BILATERAL CAROTID DUPLEX ULTRASOUND  TECHNIQUE: Pearline Cables scale imaging, color Doppler and duplex ultrasound was performed of bilateral carotid and vertebral arteries in the neck.  COMPARISON:  None.  REVIEW OF SYSTEMS: Quantification of carotid stenosis is based on velocity parameters that correlate the residual internal carotid diameter with NASCET-based stenosis levels, using the diameter of the distal internal carotid lumen as the denominator for stenosis measurement.  The following velocity measurements were obtained:  PEAK SYSTOLIC/END DIASTOLIC  RIGHT  ICA:                     34/11cm/sec  CCA:                     31/4HF/WYO  SYSTOLIC ICA/CCA RATIO:  0.7  DIASTOLIC ICA/CCA RATIO: 2.1  ECA:                     44cm/sec  LEFT  ICA:                     61/16cm/sec  CCA:  32/76DY/JWL  SYSTOLIC ICA/CCA RATIO:  1.4  DIASTOLIC ICA/CCA RATIO: 1.7  ECA:                     34cm/sec  FINDINGS: RIGHT CAROTID ARTERY: No significant plaque accumulation or stenosis. Normal waveforms and color Doppler signal.  RIGHT VERTEBRAL ARTERY:  Normal flow direction and waveform.  LEFT CAROTID ARTERY: Mild intimal thickening. No significant plaque or stenosis. Normal waveforms and color Doppler signal.  LEFT VERTEBRAL ARTERY: Normal flow direction and waveform.  IMPRESSION: 1. No significant carotid plaque or stenosis.   Electronically Signed   By: Lucrezia Europe M.D.   On: 10/13/2014 14:39    Scheduled Meds: . antiseptic oral rinse  7 mL Mouth Rinse q12n4p  . aspirin  300 mg Rectal Daily  . heparin  5,000 Units Subcutaneous 3 times per day   Continuous Infusions: . dextrose 5 % and 0.45 % NaCl with KCl 20 mEq/L 75 mL/hr at 10/15/14 2957    Principal Problem:   Acute CVA (cerebrovascular accident) Active Problems:   Dysphagia, pharyngoesophageal phase   Left hemiplegia   Elevated troponin   Bradycardia    Time  spent: 25 min    [facility] Pager [phone]. If 7PM-7AM, please contact night-coverage at [email or web address], password [facility] 10/15/2014, 12:54 PM  LOS: 4 days

## 2014-10-15 NOTE — Clinical Social Work Note (Signed)
Patient was asleep and could not be aroused.  CSW to attempt to assess patient later today.   Camella Seim D, LCSW. [phone]

## 2014-10-15 NOTE — Clinical Social Work Note (Addendum)
CSW called patient's son, Edia Pursifull, twice and received no answer.  CSW found patient's niece, Annice Needy. Ms. Wynonia Lawman advised that patient's son, Mr. Ridings, had a mental health diagnosis of paranoid schizophrenia and that he would not be able to continue caring for patient. She advised that this week alone, Mr. Hiers has told her that he was bitten by a snake and that people were attempting to eat his flesh. She also indicated that patient had moments of clarity where he would be fine and not having hallucinations and delusions.  She stated that she and Mr. Cade had spoken about Mr. Petree and that they were both in agreement with patient go to SNF. She indicated that the family would prefer for patient to remain in [place] or [place] with their first choices being [facility], [facility] and [facility] in [place].  Ms. Wynonia Lawman advised that if it came down to it, she or one of patient's other nieces would be willing to file for guardianship of patient.    CSW spoke with Dr. Hilma Favors who had concerns about patient's son's mental health wellness and his ability to make appropriate decisions on behalf of patient.  CSW spoke with Oretha Caprice at [facility]. CSW explained the situation to Ms. Wingfield as well as Dr. Cornelia Copa concerns.  Ms. Noralee Stain advised that any member of patient's family could come to DSS and speak with a social worker about guardianship and that the would walk them through the paperwork process.    CSW spoke with Ms. Wynonia Lawman and advised her of the instructions that were given by Ms. Wingfield. She stated that she would follow up about this with DSS.   CSW spoke with patient. Patient was oriented to place, situation, and person.  Patient was not oriented to time.  CSW spoke with patient about SNF placement.  Patient advised that she would go anywhere she felt was best.  CSW discussed that patient has been offered a bed at [facility].  Patient was agreeable to go to SNF at [facility].     Ihor Gully, [place]

## 2014-10-15 NOTE — Progress Notes (Signed)
CSW rec'd call from [facility] asking who would sign patient in to the SNF tomorrow- CSW advised her of contact #'s for family (son and niece). Per Ambrose Pancoast, CSW, patient is able to sign herself into SNF and son can assist. [place], MSW, [place]

## 2014-10-16 ENCOUNTER — Inpatient Hospital Stay
Admission: RE | Admit: 2014-10-16 | Discharge: 2015-08-13 | Disposition: E | Payer: Medicare Other | Source: Ambulatory Visit | Attending: Internal Medicine | Admitting: Internal Medicine

## 2014-10-16 DIAGNOSIS — R05 Cough: Secondary | ICD-10-CM

## 2014-10-16 DIAGNOSIS — R1310 Dysphagia, unspecified: Principal | ICD-10-CM

## 2014-10-16 DIAGNOSIS — R059 Cough, unspecified: Secondary | ICD-10-CM

## 2014-10-16 LAB — GLUCOSE, CAPILLARY
GLUCOSE-CAPILLARY: 134 mg/dL — AB (ref 65–99)
GLUCOSE-CAPILLARY: 92 mg/dL (ref 65–99)
Glucose-Capillary: 91 mg/dL (ref 65–99)
Glucose-Capillary: 101 mg/dL — ABNORMAL HIGH (ref 65–99)

## 2014-10-16 MED ORDER — ASPIRIN 325 MG PO TABS: 325.0000 mg | ORAL_TABLET | Freq: Every day | ORAL | Status: DC

## 2014-10-16 MED ORDER — ATORVASTATIN CALCIUM 10 MG PO TABS: 10.0000 mg | ORAL_TABLET | Freq: Every day | ORAL | Status: DC

## 2014-10-16 NOTE — Progress Notes (Signed)
Discussion with Joelyn Oms by phone this AM to discuss his mom's goals of care. He understands the severity of her illness however when I asked him what his plans were if his mom died or if she wasn't able to ever come home and he became delusional with pressured speech on the phone talking about the [employer] and some camp in [place] that he wanted to take he and his mom to once he got better. He made inappropriate racial remarks and demonstrated evidence of paranoia. He explained that he thought his mother's blood got contaminated at [facility] and he is waiting on someone to contact him about this. I spoke to [place] who is going to contact APS- his psychiatric history is unknown, but I do not think he has capacity to make decisions for his mother. Plans noted for [facility]- will follow from a palliative standpoint and try to help transition her to hospice.  Will have Palliative NP Quinn Axe follow up this afternoon.  Lane Hacker, DO Palliative Medicine [phone]

## 2014-10-16 NOTE — Discharge Summary (Signed)
Physician Discharge Summary  Tara Welch WUJ:[id number] DOB: [birth year] DOA: 10/11/2014  PCP: No primary care provider on file.  Admit date: 10/11/2014 Discharge date: 11/03/2014  Time spent:40 minutes  Recommendations for Outpatient Follow-up:  1. Patient will discharge to [facility] 2. She will need continued physical/occupational/speech therapy 3. Strongly recommend getting hospice involved if patient's condition deteriorates. She is currently a DNR  Discharge Diagnoses:  Principal Problem:   Acute CVA (cerebrovascular accident) Active Problems:   Dysphagia, pharyngoesophageal phase   Left hemiplegia   Elevated troponin   Bradycardia   Palliative care encounter   Discharge Condition: stable  Diet recommendation: pureed diet with honey thick liquids  Filed Weights   10/12/14 0500 10/12/14 2000 10/14/14 0500  Weight: 55.021 kg (121 lb 4.8 oz) 55.4 kg (122 lb 2.2 oz) 56.2 kg (123 lb 14.4 oz)    History of present illness:  This patient was brought to the hospital with altered mental status and left sided weakness. She was found to have a large sub acute infarct in the MCA territory. She was admitted for further treatments  Hospital Course:  Patient was monitored in the step down unit. She was found to have a dense hemiplegia on the left, dysphagia and left sided neglect. She was seen by neurology who felt that the patient should be on a aspirin. She was started on a statin since LDL was >70. Echocardiogram and carotid dopplers unremarkable  She also developed significant bradycardia. This was felt to be related to her stroke. Her Toprol has been discontinued and may be resumed in the outpatient setting as felt appropriate.   Due to the severity of her deficits and poor functional status, palliative care met with patient and son and discussed goals of care. It does not appear that they are ready to accept hospice care at this time. It is likely that her long term  prognosis is poor and that she may suffer complications of the stroke. In any case, she will be sent to the [facility] for physical therapy. I would strongly recommend that if the patient's condition starts to deteriorate, then hospice referral should be made.  Procedures:  Echo: - Left ventricle: The cavity size was normal. Wall thickness was normal. Systolic function was vigorous. The estimated ejection fraction was in the range of 65% to 70%. Wall motion was normal; there were no regional wall motion abnormalities. Features are consistent with a pseudonormal left ventricular filling pattern, with concomitant abnormal relaxation and increased filling pressure (grade 2 diastolic dysfunction). Doppler parameters are consistent with high ventricular filling pressure. - Aortic valve: Moderately calcified annulus. Moderately thickened, severely calcified leaflets. There was moderate stenosis. There was moderate regurgitation. Peak velocity (S): 247 cm/s. Mean gradient (S): 12 mm Hg. Valve area (VTI): 1.01 cm^2. Valve area (Vmax): 0.98 cm^2. Valve area (Vmean): 0.96 cm^2. - Mitral valve: Severely calcified annulus. There was mild regurgitation. - Left atrium: The atrium was mildly to moderately dilated. Volume/bsa, ES, (1-plane Simpson&'s, A2C): 35.9 ml/m^2. - Right ventricle: Systolic function was moderately reduced. - Right atrium: The atrium was moderately dilated. - Atrial septum: No defect or patent foramen ovale was identified. - Tricuspid valve: There was moderate-severe regurgitation. - Inferior vena cava: The vessel was dilated. The respirophasic diameter changes were blunted (< 50%), consistent with elevated central venous pressure.  Consultations:  Neurology  Discharge Exam: Filed Vitals:   10/21/2014 0558  BP: 127/71  Pulse: 66  Temp: 99.1 F (37.3 C)  Resp: 18    General: NAD Cardiovascular: S1, S2 RRR Respiratory: CTA  B  Discharge Instructions    Current Discharge Medication List    START taking these medications   Details  aspirin 325 MG tablet Take 1 tablet (325 mg total) by mouth daily.    atorvastatin (LIPITOR) 10 MG tablet Take 1 tablet (10 mg total) by mouth daily at 6 PM.      CONTINUE these medications which have NOT CHANGED   Details  SYNTHROID 50 MCG tablet Take 1 tablet by mouth daily.      STOP taking these medications     metoprolol succinate (TOPROL-XL) 50 MG 24 hr tablet        No Known Allergies    The results of significant diagnostics from this hospitalization (including imaging, microbiology, ancillary and laboratory) are listed below for reference.    Significant Diagnostic Studies: Dg Chest 1 View  10/13/2014   CLINICAL DATA:  Post breast reconstruction surgery, MRI clearance  EXAM: CHEST  1 VIEW  COMPARISON:  None  FINDINGS: Normal heart size, mediastinal contours and pulmonary vascularity.  Abnormal soft tissue density projects over the lower trachea just above the carina, uncertain etiology, uncertain if related to the lung, mediastinum or superimposed soft tissue.  Deformity of the RIGHT breast from prior reconstruction with prosthesis.  No metallic foreign bodies identified.  Lungs clear.  No infiltrate, pleural effusion or pneumothorax.  Bones demineralized.  IMPRESSION: Prior RIGHT breast reconstructive surgery with prosthesis.  No metallic foreign bodies identified.  Questionable nodular density/abnormal soft tissue projecting over the inferior trachea, uncertain etiology; followup CT imaging of the chest with contrast recommended to assess.   Electronically Signed   By: Lavonia Dana M.D.   On: 10/13/2014 11:49   Ct Head Wo Contrast  10/11/2014   CLINICAL DATA:  Stroke symptoms. Last known normal at lunch yesterday.  EXAM: CT HEAD WITHOUT CONTRAST  TECHNIQUE: Contiguous axial images were obtained from the base of the skull through the vertex without intravenous  contrast.  COMPARISON:  None.  FINDINGS: There is rather extensive hypodensity involving right temporal, frontal, and parietal lobes in the MCA distribution consistent with subacute infarct. There is likely some degree of surrounding edema. No definite associated midline shift. There is no intracranial hemorrhage. Periventricular white matter hypodensity is nonspecific, likely chronic small vessel ischemia. No intracranial or subdural collection. Age related atrophy. Atherosclerosis of the intracranial vasculature at the skull base. The calvarium is intact. There is mucosal thickening of the ethmoid air cells. The mastoid air cells are well aerated.  IMPRESSION: 1. Findings consistent with large subacute infarct in the right MCA distribution. Likely some degree of surrounding edema but no midline shift. 2. Background atrophy and chronic small vessel ischemic change. These results were called by telephone at the time of interpretation on 10/11/2014 at 3:30 am to Dr. Shanon Rosser , who verbally acknowledged these results.   Electronically Signed   By: Jeb Levering M.D.   On: 10/11/2014 03:42   Mr Brain Wo Contrast  10/13/2014   ADDENDUM REPORT: 10/13/2014 13:00  ADDENDUM: Study discussed by telephone with Dr. Jolaine Artist Jeral Zick on 10/13/2014 at 1251 hrs.   Electronically Signed   By: Genevie Ann M.D.   On: 10/13/2014 13:00   10/13/2014   CLINICAL DATA:  81 year old female with altered mental status, not responding. Stroke symptoms on 10/11/2014. Initial encounter.  EXAM: MRI HEAD WITHOUT CONTRAST  TECHNIQUE: Multiplanar, multiecho pulse sequences of the brain and  surrounding structures were obtained without intravenous contrast.  COMPARISON:  Head CT without contrast 10/11/2014.  FINDINGS: Large area of intensely restricted diffusion involving the majority of the right MCA territory. The right basal ganglia are relatively spared. Confluent cytotoxic edema with T2 and FLAIR hyperintensity. No hemorrhagic transformation  identified. Mass effect on the right lateral ventricle without midline shift or ventriculomegaly.  Small 5-6 mm focus of restricted diffusion in the left superior frontal gyrus (series 100, image 37). No other contralateral restricted diffusion, and no posterior fossa restriction.  Major intracranial vascular flow voids are preserved.  Basilar cisterns are patent. No acute intracranial hemorrhage identified. Superimposed on the acute findings, mild to moderate for age patchy in scattered nonspecific cerebral white matter T2 and FLAIR hyperintensity. Deep gray matter nuclei, brainstem and cerebellum are within normal limits for age. Visible internal auditory structures appear normal. Visualized paranasal sinuses and mastoids are clear. Postoperative changes to the globes. Negative pituitary, cervicomedullary junction and visualized cervical spine. Normal bone marrow signal. Visualized scalp soft tissues are within normal limits.  IMPRESSION: 1. Large right MCA acute infarct with cytotoxic edema but no hemorrhage. Relatively mild mass effect at this time, with partial effacement of the right lateral ventricle but no midline shift. No ventriculomegaly. 2. Noncontrast head CT can be used for surveillance of intracranial mass effect. 3. Small lacunar type infarct also in the left superior frontal gyrus, favor synchronous small vessel disease.  Electronically Signed: By: Genevie Ann M.D. On: 10/13/2014 12:20   US Carotid Bilateral  10/13/2014   CLINICAL DATA:  Stroke.  Previous tobacco abuse.  EXAM: BILATERAL CAROTID DUPLEX ULTRASOUND  TECHNIQUE: Pearline Cables scale imaging, color Doppler and duplex ultrasound was performed of bilateral carotid and vertebral arteries in the neck.  COMPARISON:  None.  REVIEW OF SYSTEMS: Quantification of carotid stenosis is based on velocity parameters that correlate the residual internal carotid diameter with NASCET-based stenosis levels, using the diameter of the distal internal carotid lumen as  the denominator for stenosis measurement.  The following velocity measurements were obtained:  PEAK SYSTOLIC/END DIASTOLIC  RIGHT  ICA:                     34/11cm/sec  CCA:                     97/3ZH/GDJ  SYSTOLIC ICA/CCA RATIO:  0.7  DIASTOLIC ICA/CCA RATIO: 2.1  ECA:                     44cm/sec  LEFT  ICA:                     61/16cm/sec  CCA:                     24/26ST/MHD  SYSTOLIC ICA/CCA RATIO:  1.4  DIASTOLIC ICA/CCA RATIO: 1.7  ECA:                     34cm/sec  FINDINGS: RIGHT CAROTID ARTERY: No significant plaque accumulation or stenosis. Normal waveforms and color Doppler signal.  RIGHT VERTEBRAL ARTERY:  Normal flow direction and waveform.  LEFT CAROTID ARTERY: Mild intimal thickening. No significant plaque or stenosis. Normal waveforms and color Doppler signal.  LEFT VERTEBRAL ARTERY: Normal flow direction and waveform.  IMPRESSION: 1. No significant carotid plaque or stenosis.   Electronically Signed   By: Lucrezia Europe M.D.   On: 10/13/2014 14:39  Microbiology: Recent Results (from the past 240 hour(s))  MRSA PCR Screening     Status: None   Collection Time: 10/12/14  7:20 PM  Result Value Ref Range Status   MRSA by PCR NEGATIVE NEGATIVE Final    Comment:        The GeneXpert MRSA Assay (FDA approved for NASAL specimens only), is one component of a comprehensive MRSA colonization surveillance program. It is not intended to diagnose MRSA infection nor to guide or monitor treatment for MRSA infections.      Labs: Basic Metabolic Panel:  Recent Labs Lab 10/11/14 0300 10/13/14 1051 10/14/14 0532 10/15/14 0525  NA 133* 137 137 132*  K 3.8 3.2* 3.1* 3.5  CL 98* 105 105 103  CO2 23 23 23  21*  GLUCOSE 140* 138* 140* 134*  BUN 43* 34* 29* 20  CREATININE 0.98 0.73 0.67 0.57  CALCIUM 8.8* 8.2* 8.0* 7.7*   Liver Function Tests:  Recent Labs Lab 10/11/14 0300  AST 53*  ALT 95*  ALKPHOS 56  BILITOT 1.5*  PROT 6.9  ALBUMIN 3.7   No results for input(s): LIPASE,  AMYLASE in the last 168 hours. No results for input(s): AMMONIA in the last 168 hours. CBC:  Recent Labs Lab 10/11/14 0300 10/13/14 1051 10/14/14 0532  WBC 11.1* 7.3 7.5  NEUTROABS 9.9*  --   --   HGB 12.5 11.0* 11.0*  HCT 37.5 32.9* 33.3*  MCV 94.2 95.4 96.0  PLT 208 184 185   Cardiac Enzymes:  Recent Labs Lab 10/11/14 0300 10/12/14 1814  CKTOTAL 541*  --   TROPONINI  --  0.95*   BNP: BNP (last 3 results) No results for input(s): BNP in the last 8760 hours.  ProBNP (last 3 results) No results for input(s): PROBNP in the last 8760 hours.  CBG:  Recent Labs Lab 10/15/14 1624 10/15/14 2100 10/28/2014 0017 11/10/2014 0439 10/30/2014 0708  GLUCAP 101* 86 92 91 101*       Signed:  Rigoberto Repass  [facility] 10/28/2014, 7:36 AM

## 2014-10-16 NOTE — Progress Notes (Signed)
Physical Therapy Treatment Patient Details Name: Tara Welch MRN: [id number] DOB: [birth year] Today's Date: 10/15/2014    History of Present Illness Pt is a 81 year old female who lives with her son independently.  She sustained a right MCA territory stroke and now has dense left hemiparesis.    PT Comments    Pt continues to be very drowsy but would awaken for most of PT visit.  She is still able to turn her head to the left but prefers the right.  She does not have any active movement noted in the left extremities but in right sidelying she did have slight ability to protract her left hip and retract the right shoulder.  She required total assist to come to sitting with zero sitting balance today.  She was transferred bed to chair with total assist.  Follow Up Recommendations  SNF     Equipment Recommendations  None recommended by PT    Recommendations for Other Services  OT, ST     Precautions / Restrictions Precautions Precautions: Fall Restrictions Weight Bearing Restrictions: No    Mobility  Bed Mobility Overal bed mobility: Needs Assistance Bed Mobility: Rolling Rolling: Total assist   Supine to sit: Total assist     General bed mobility comments: able to achieve minimal pelvic forward thrust and shoulder retraction while supine on the right  Transfers Overall transfer level: Needs assistance Equipment used: None Transfers: Stand Pivot Transfers   Stand pivot transfers: Total assist       General transfer comment: pt may have been able to bear a slight amount of weight on the LLE during transfer  Ambulation/Gait                 Stairs            Wheelchair Mobility    Modified Rankin (Stroke Patients Only)       Balance   Sitting-balance support: Single extremity supported;Feet supported Sitting balance-Leahy Scale: Zero                              Cognition Arousal/Alertness: Lethargic Behavior During Therapy: WFL  for tasks assessed/performed Overall Cognitive Status: Within Functional Limits for tasks assessed                      Exercises Other Exercises Other Exercises: sidelying PNF for facilitation of hip protraction and shoulder retraction    General Comments        Pertinent Vitals/Pain Pain Assessment: No/denies pain    Home Living                      Prior Function            PT Goals (current goals can now be found in the care plan section) Progress towards PT goals: Progressing toward goals    Frequency  Min 5X/week    PT Plan Current plan remains appropriate    Co-evaluation             End of Session Equipment Utilized During Treatment: Gait belt Activity Tolerance: Patient limited by fatigue Patient left: in chair;with call bell/phone within reach;with nursing/sitter in room     Time: 6269-4854 PT Time Calculation (min) (ACUTE ONLY): 28 min  Charges:  $Therapeutic Exercise: 8-22 mins $Therapeutic Activity: 8-22 mins  G CodesSable Feil  PT 10/17/2014, 9:58 AM [phone]

## 2014-10-16 NOTE — Progress Notes (Signed)
Pt discharged to [facility] today per Dr. Roderic Palau.  Pt's IV site D/C'd and WDL.  Pt's VSS.  Report called to Asa Lente, nurse at [facility].  Verbalized understanding.  Pt left floor via hospital bed in stable condition accompanied by RN and NT.

## 2014-10-17 ENCOUNTER — Encounter (HOSPITAL_COMMUNITY)
Admission: RE | Admit: 2014-10-17 | Discharge: 2014-10-17 | Disposition: A | Discharge status: Home / Self Care | Payer: Medicare Other | Source: Skilled Nursing Facility | Attending: Internal Medicine | Admitting: Internal Medicine

## 2014-10-17 LAB — URINALYSIS, ROUTINE W REFLEX MICROSCOPIC
BILIRUBIN URINE: NEGATIVE
Glucose, UA: NEGATIVE mg/dL
KETONES UR: NEGATIVE mg/dL
NITRITE: NEGATIVE
Protein, ur: NEGATIVE mg/dL
Specific Gravity, Urine: 1.02 (ref 1.005–1.030)
Urobilinogen, UA: 1 mg/dL (ref 0.0–1.0)
pH: 5.5 (ref 5.0–8.0)

## 2014-10-17 LAB — URINE MICROSCOPIC-ADD ON

## 2014-10-17 LAB — BASIC METABOLIC PANEL
Anion gap: 9 (ref 5–15)
BUN: 15 mg/dL (ref 6–20)
CALCIUM: 8 mg/dL — AB (ref 8.9–10.3)
CHLORIDE: 101 mmol/L (ref 101–111)
CO2: 22 mmol/L (ref 22–32)
CREATININE: 0.6 mg/dL (ref 0.44–1.00)
GFR calc non Af Amer: >60 mL/min (ref >60)
Glucose, Bld: 103 mg/dL — ABNORMAL HIGH (ref 65–99)
Potassium: 3.2 mmol/L — ABNORMAL LOW (ref 3.5–5.1)
Sodium: 132 mmol/L — ABNORMAL LOW (ref 135–145)

## 2014-10-18 ENCOUNTER — Non-Acute Institutional Stay (SKILLED_NURSING_FACILITY): Payer: Medicare Other | Admitting: Internal Medicine

## 2014-10-18 ENCOUNTER — Encounter: Payer: Self-pay | Admitting: Internal Medicine

## 2014-10-18 DIAGNOSIS — G8190 Hemiplegia, unspecified affecting unspecified side: Secondary | ICD-10-CM

## 2014-10-18 DIAGNOSIS — I639 Cerebral infarction, unspecified: Secondary | ICD-10-CM

## 2014-10-18 DIAGNOSIS — E876 Hypokalemia: Secondary | ICD-10-CM | POA: Diagnosis not present

## 2014-10-18 DIAGNOSIS — G8194 Hemiplegia, unspecified affecting left nondominant side: Secondary | ICD-10-CM

## 2014-10-18 DIAGNOSIS — R001 Bradycardia, unspecified: Secondary | ICD-10-CM | POA: Diagnosis not present

## 2014-10-18 NOTE — Progress Notes (Signed)
Patient ID: ALLE DIFABIO, female   DOB: 06-26-1921, 81 y.o.   MRN: [id number]   This is an acute visit.  Level care skilled.  Facility [facility].  Chief complaint-acute visit status post hospitalization for large subacute infarct MCA territory with residual left-sided hemiparalysis.  History of present illness.  Patient is a pleasant 81 year old female was brought to the hospital with altered mental status and left-sided weakness-she was found to have a large subacute infarct in the MCA territory.  She was seen by by neurology and started on aspirin-she also was started on a statin.  Echocardiogram and carotid Dopplers were unremarkable.  She also developed significant bradycardia-this was thought to be stroke related-her Toprol was discontinued.  Palliative care was consult at secondary to the severity of her deficits and poor functional status.  Hospice care was deferred at this time.  Apparently social services is working on who will be her responsible party-  apparently patient had been living with her son -   Currently she has no acute complaints of pain or discomfort-she is able to speak and cognitively appears to be intact her vital signs appear to be stable.   Previous medical history.  Acute CVA.  Dysphagia.  Left hemiplegia.  Bradycardia.  Hypothyroidism.  .    Family medical social history has been reviewed per discharge note on 11/05/2014.   Medications.  Aspirin 325 mg daily.  Lipitor 10 mg daily.  Synthroid 50 g daily.  Again her Toprol has been discontinued for now.  Review of systems-. Prograf obtain from patient and nursing staff.  In general no reports of fever chills or acute pain.  Skin does not complain of any itching or rashes she does have a small open area sacral area.  Head ears eyes nose mouth and throat-does not complain of visual changes or eye pain.  Respiratory-does not complain of shortness breath or  cough.  Cardiac no chest pain.  GI does have dysphagia does not complaining of abdominal discomfort no nausea or vomiting diarrhea or constipation noted.  GU is not complaining of dysuria.  Musculoskeletal again has CVA with dense left-sided hemiparalysis does not complain of pain currently.  Neurologic again as noted above left-sided hemiparesis appears to have a mild droop as well.  Marland Kitchen  Psych she actually appears to be alert and interactive smiling apparently depression at this point has not been an issue but will have to be watched.  Physical exam.  Temperature is 97.3 pulse 81 respirations 18 blood pressure 127/60.  In general this is a pleasant elderly female in no distress she does appear to have significant neurologic deficits on the left side with hemiparesis and a mouth drooped.  Her skin is warm and dry there is a very small open area in the sacral area there is no sign of infection drainage tenderness or surrounding erythema.  Eyes pupils appear reactive to light sclera and conjunctiva are clear visual acuity appears to be grossly intact.  Oropharynx she does have a slight white film on her tongue mucous membranes appear fairly moist.  Chest is clear to auscultation there is no labored breathing.  Heart is regular rate and rhythm without murmur gallop or rub she does not have significant lower extremity edema.  Her abdomen is soft nontender with positive bowel sounds.  GU she does have a Foley catheter.  I could not appreciate any suprapubic tenderness or vaginal discharge.  Musculoskeletal she does have a dense left-sided hemiparalysis and as well  as a mouth drooped-right side strength upper and lower extremities appear to be preserved she is able to speak some  appears cognitively intact   Did not note any deformities other than arthritic changes   Neurologic-as stated above-   Psych she is oriented to month year was able to speak some appeared elderly  superficially grossly intact.  Labs.  10/17/2014.  Sodium 132 potassium 3.2 BUN 15 creatinine 0.6.  10/14/2014.  WBC 7.5 hemoglobin 11.0 platelets 185.  Assessment plan.  #1-CVA late effect with dense left-sided hemiparalysis-she has been started on aspirin and a statin-I suspect her rehabilitation course will be very challenging-palliative care did call me about following her in the facility although I'm not sure if this will be possible--will await input from the facility on this.  Clinically she appears to be stable her vital signs are stable-.  #2-history of hypokalemia appears her potassium is 3.2 this will have to be supplemented will give her potassium 20 mEq twice a day today and reduce to 20 mEq daily and update a BMP on Friday, June 10.  #3-history of oral thrush?-Will write an order for nystatin back to be swabbed when necessary if film persists notify provider.  #4-small sacral open area-this will need to be followed by wound care at this point appears to be fairly benign but certainly will have to be addressed.  #5-history of hypothyroidism-she is on Synthroid-TSH was 2.784 in the hospital on June 1    #6 bradycardia-she is nowoff the Toprol-nonetheless blood pressure appears to be stable as well as pulse rate most recently in the 70s and 80s   [id number]-of note greater than 35 minutes spent assessing patient-reviewing her chart-and coordinating and formulating a plan of care for numerous diagnoses-of note greater than 50% of time spent coordinating plan of care.

## 2014-10-19 ENCOUNTER — Other Ambulatory Visit (HOSPITAL_COMMUNITY)
Admission: AD | Admit: 2014-10-19 | Discharge: 2014-10-19 | Disposition: A | Discharge status: Home / Self Care | Payer: Medicare Other | Source: Skilled Nursing Facility | Attending: Internal Medicine | Admitting: Internal Medicine

## 2014-10-19 DIAGNOSIS — R278 Other lack of coordination: Secondary | ICD-10-CM | POA: Insufficient documentation

## 2014-10-19 DIAGNOSIS — M6281 Muscle weakness (generalized): Secondary | ICD-10-CM | POA: Diagnosis present

## 2014-10-19 LAB — URINE CULTURE: Colony Count: >=100000

## 2014-10-19 LAB — BASIC METABOLIC PANEL
Anion gap: 7 (ref 5–15)
BUN: 14 mg/dL (ref 6–20)
CALCIUM: 8.3 mg/dL — AB (ref 8.9–10.3)
CO2: 26 mmol/L (ref 22–32)
CREATININE: 0.56 mg/dL (ref 0.44–1.00)
Chloride: 104 mmol/L (ref 101–111)
GFR calc Af Amer: >60 mL/min (ref >60)
GFR calc non Af Amer: >60 mL/min (ref >60)
Glucose, Bld: 103 mg/dL — ABNORMAL HIGH (ref 65–99)
Potassium: 3.5 mmol/L (ref 3.5–5.1)
SODIUM: 137 mmol/L (ref 135–145)

## 2014-10-19 LAB — CBC WITH DIFFERENTIAL/PLATELET
BASOS ABS: 0 10*3/uL (ref 0.0–0.1)
Basophils Relative: 0 % (ref 0–1)
Eosinophils Absolute: 0.1 10*3/uL (ref 0.0–0.7)
Eosinophils Relative: 1 % (ref 0–5)
HCT: 34.5 % — ABNORMAL LOW (ref 36.0–46.0)
HEMOGLOBIN: 11.4 g/dL — AB (ref 12.0–15.0)
Lymphocytes Relative: 12 % (ref 12–46)
Lymphs Abs: 0.9 10*3/uL (ref 0.7–4.0)
MCH: 31.5 pg (ref 26.0–34.0)
MCHC: 33 g/dL (ref 30.0–36.0)
MCV: 95.3 fL (ref 78.0–100.0)
Monocytes Absolute: 0.7 10*3/uL (ref 0.1–1.0)
Monocytes Relative: 9 % (ref 3–12)
Neutro Abs: 5.8 10*3/uL (ref 1.7–7.7)
Neutrophils Relative %: 78 % — ABNORMAL HIGH (ref 43–77)
Platelets: 260 10*3/uL (ref 150–400)
RBC: 3.62 MIL/uL — ABNORMAL LOW (ref 3.87–5.11)
RDW: 13.4 % (ref 11.5–15.5)
WBC: 7.5 10*3/uL (ref 4.0–10.5)

## 2014-10-22 ENCOUNTER — Encounter (HOSPITAL_COMMUNITY)
Admission: AD | Admit: 2014-10-22 | Discharge: 2014-10-22 | Disposition: A | Discharge status: Home / Self Care | Payer: Medicare Other | Source: Skilled Nursing Facility | Attending: Internal Medicine | Admitting: Internal Medicine

## 2014-10-22 LAB — BASIC METABOLIC PANEL
Anion gap: 11 (ref 5–15)
BUN: 19 mg/dL (ref 6–20)
CALCIUM: 8.4 mg/dL — AB (ref 8.9–10.3)
CO2: 23 mmol/L (ref 22–32)
Chloride: 102 mmol/L (ref 101–111)
Creatinine, Ser: 0.59 mg/dL (ref 0.44–1.00)
GFR calc Af Amer: >60 mL/min (ref >60)
GFR calc non Af Amer: >60 mL/min (ref >60)
GLUCOSE: 97 mg/dL (ref 65–99)
POTASSIUM: 5 mmol/L (ref 3.5–5.1)
Sodium: 136 mmol/L (ref 135–145)

## 2014-10-25 ENCOUNTER — Non-Acute Institutional Stay (SKILLED_NURSING_FACILITY): Payer: Medicare Other | Admitting: Internal Medicine

## 2014-10-25 ENCOUNTER — Other Ambulatory Visit (HOSPITAL_COMMUNITY)
Admission: RE | Admit: 2014-10-25 | Discharge: 2014-10-25 | Disposition: A | Discharge status: Home / Self Care | Payer: Medicare Other | Source: Ambulatory Visit | Attending: Internal Medicine | Admitting: Internal Medicine

## 2014-10-25 DIAGNOSIS — I639 Cerebral infarction, unspecified: Secondary | ICD-10-CM

## 2014-10-25 DIAGNOSIS — G8194 Hemiplegia, unspecified affecting left nondominant side: Secondary | ICD-10-CM

## 2014-10-25 DIAGNOSIS — E876 Hypokalemia: Secondary | ICD-10-CM | POA: Diagnosis not present

## 2014-10-25 DIAGNOSIS — R339 Retention of urine, unspecified: Secondary | ICD-10-CM

## 2014-10-25 DIAGNOSIS — R001 Bradycardia, unspecified: Secondary | ICD-10-CM | POA: Diagnosis not present

## 2014-10-25 DIAGNOSIS — G8190 Hemiplegia, unspecified affecting unspecified side: Secondary | ICD-10-CM | POA: Diagnosis not present

## 2014-10-25 LAB — BASIC METABOLIC PANEL
Anion gap: 9 (ref 5–15)
BUN: 23 mg/dL — AB (ref 6–20)
CHLORIDE: 102 mmol/L (ref 101–111)
CO2: 26 mmol/L (ref 22–32)
Calcium: 8.6 mg/dL — ABNORMAL LOW (ref 8.9–10.3)
Creatinine, Ser: 0.58 mg/dL (ref 0.44–1.00)
GFR calc Af Amer: >60 mL/min (ref >60)
GFR calc non Af Amer: >60 mL/min (ref >60)
GLUCOSE: 103 mg/dL — AB (ref 65–99)
POTASSIUM: 3.9 mmol/L (ref 3.5–5.1)
SODIUM: 137 mmol/L (ref 135–145)

## 2014-10-26 NOTE — Progress Notes (Signed)
Patient ID: Tara Welch, female   DOB: Nov 21, 1921, 81 y.o.   MRN: [id number]                 HISTORY & PHYSICAL  DATE:  10/25/2014         FACILITY: [facility]                       LEVEL OF CARE:   SNF   CHIEF COMPLAINT:  Admission to SNF, post stay at [facility], 10/11/2014 through 10/13/2014.     HISTORY OF PRESENT ILLNESS:  This is a 81 year-old woman who was living at home with her son fairly independently, per her description.    She was brought to the hospital with altered mental status and left-sided weakness.  She was found to have a large subacute infarct in the right middle cerebral artery territory.  She went on to develop dense hemiplegia on the left and severe left-sided neglect.  She was seen by Neurology, who recommended aspirin.  She was started on a statin since her LDL was greater than 70.  Echocardiogram and carotid dopplers were negative.    She developed significant bradycardia.  Her Toprol XL was discontinued.    The patient was seen in hospital by Palliative Care.   They were not ready to accept Hospice.   It was felt that her long-term prognosis was poor.    Other medical issues included hypokalemia in the hospital, although this was aggressively corrected.  Her potassium was 5 on 10/22/2014 and 3.9 this morning.  BUN and creatinine are normal.    PAST MEDICAL HISTORY/PROBLEM LIST:           Right breast cancer.    Thyroid disease.    PAST SURGICAL HISTORY:             Right breast reconstruction.    Eye surgery.    CURRENT MEDICATIONS:  Discharge medications include:     Aspirin 325 q.d.     Lipitor 10 q.d.        Synthroid 50 q.d. (TSH checked in the hospital at 2.784).      SOCIAL HISTORY:   As mentioned, the patient did not accept Hospice care.  In fact, she is still a Full Code in the facility.   FUNCTIONAL STATUS:  Per the patient, she was independent with ADLs.  She is on a pureed diet with honey-thick liquids.    REVIEW OF  SYSTEMS:            CHEST/RESPIRATORY:  No shortness of breath.     CARDIAC:  No chest pain.    GI:   No nausea, vomiting, or abdominal pain.   GU:  She has a Foley catheter in place.  I can find nothing in the chart about this.     PHYSICAL EXAMINATION:   GENERAL APPEARANCE:  Pleasant, elderly woman in no distress.   CHEST/RESPIRATORY:  Clear air entry bilaterally.     CARDIOVASCULAR:   CARDIAC:  Heart sounds are normal.  She appears to be euvolemic.   GASTROINTESTINAL:   ABDOMEN:  Slightly distended, but bowel sounds are positive.  There is no tenderness.     LIVER/SPLEEN/KIDNEY:   No liver, no spleen.  No tenderness.    GENITOURINARY:   BLADDER:  Again, Foley catheter in place.   CIRCULATION:   EDEMA/VARICOSITIES:  Extremities:  No edema.   NEUROLOGICAL:   CRANIAL NERVES:  She has  a right upper motor neuron 7th, a left homonymous hemianopsia, and severe left neglect.   SENSATION/STRENGTH:  She has sensory extinction on the left.  Complete left hemiparesis.     ASSESSMENT/PLAN:                        Extensive right middle cerebral artery stroke.  As noted, she has dense left hemiparesis and left homonymous hemianopsia most likely, as well as severe left neglect.  This does not bode well for any form of functional recovery.  She was approached by Palliative Care in the hospital.  Not only did they refuse hospice-mediated care, she is a Full Code here.    Bradycardia.   Pulse rate currently 68.  This appears to have resolved off the Toprol.    On statin for secondary prevention.  I will continue this.    Hypokalemia.  This was corrected.  I note that the potassium fell fairly precipitously from the 10th til today, from 5 to 3.9.  I may give her some supplements in fear of this dropping even further.    Hypothyroidism.  On replacement.   Her TSH was checked in the hospital and was normal.    ?Urinary retention.  I really do not see anything about this in the chart.  I think we can  probably do a voiding trial within the next day or so.     CPT CODE: 88916

## 2014-11-01 ENCOUNTER — Encounter (HOSPITAL_COMMUNITY)
Admission: RE | Admit: 2014-11-01 | Discharge: 2014-11-01 | Disposition: A | Discharge status: Home / Self Care | Payer: Medicare Other | Source: Skilled Nursing Facility | Attending: Internal Medicine | Admitting: Internal Medicine

## 2014-11-01 LAB — COMPREHENSIVE METABOLIC PANEL
ALBUMIN: 3 g/dL — AB (ref 3.5–5.0)
ALT: 43 U/L (ref 14–54)
AST: 44 U/L — ABNORMAL HIGH (ref 15–41)
Alkaline Phosphatase: 71 U/L (ref 38–126)
Anion gap: 9 (ref 5–15)
BILIRUBIN TOTAL: 0.5 mg/dL (ref 0.3–1.2)
BUN: 20 mg/dL (ref 6–20)
CHLORIDE: 101 mmol/L (ref 101–111)
CO2: 27 mmol/L (ref 22–32)
CREATININE: 0.58 mg/dL (ref 0.44–1.00)
Calcium: 8.8 mg/dL — ABNORMAL LOW (ref 8.9–10.3)
GFR calc Af Amer: >60 mL/min (ref >60)
Glucose, Bld: 106 mg/dL — ABNORMAL HIGH (ref 65–99)
POTASSIUM: 3.9 mmol/L (ref 3.5–5.1)
Sodium: 137 mmol/L (ref 135–145)
Total Protein: 6.4 g/dL — ABNORMAL LOW (ref 6.5–8.1)

## 2014-11-04 LAB — BASIC METABOLIC PANEL
ANION GAP: 5 (ref 5–15)
BUN: 21 mg/dL — ABNORMAL HIGH (ref 6–20)
CALCIUM: 8.7 mg/dL — AB (ref 8.9–10.3)
CO2: 30 mmol/L (ref 22–32)
Chloride: 104 mmol/L (ref 101–111)
Creatinine, Ser: 0.64 mg/dL (ref 0.44–1.00)
GFR calc non Af Amer: >60 mL/min (ref >60)
Glucose, Bld: 102 mg/dL — ABNORMAL HIGH (ref 65–99)
Potassium: 4.2 mmol/L (ref 3.5–5.1)
SODIUM: 139 mmol/L (ref 135–145)

## 2014-11-09 LAB — BASIC METABOLIC PANEL
ANION GAP: 8 (ref 5–15)
BUN: 21 mg/dL — AB (ref 6–20)
CALCIUM: 8.8 mg/dL — AB (ref 8.9–10.3)
CO2: 26 mmol/L (ref 22–32)
Chloride: 102 mmol/L (ref 101–111)
Creatinine, Ser: 0.57 mg/dL (ref 0.44–1.00)
Glucose, Bld: 115 mg/dL — ABNORMAL HIGH (ref 65–99)
Potassium: 3.9 mmol/L (ref 3.5–5.1)
Sodium: 136 mmol/L (ref 135–145)

## 2014-11-10 ENCOUNTER — Encounter: Payer: Self-pay | Admitting: Internal Medicine

## 2014-11-10 ENCOUNTER — Non-Acute Institutional Stay (SKILLED_NURSING_FACILITY): Payer: Medicare Other | Admitting: Internal Medicine

## 2014-11-10 DIAGNOSIS — R634 Abnormal weight loss: Secondary | ICD-10-CM | POA: Diagnosis not present

## 2014-11-10 DIAGNOSIS — G8190 Hemiplegia, unspecified affecting unspecified side: Secondary | ICD-10-CM

## 2014-11-10 DIAGNOSIS — R630 Anorexia: Secondary | ICD-10-CM | POA: Diagnosis not present

## 2014-11-10 DIAGNOSIS — I639 Cerebral infarction, unspecified: Secondary | ICD-10-CM

## 2014-11-10 DIAGNOSIS — G8194 Hemiplegia, unspecified affecting left nondominant side: Secondary | ICD-10-CM

## 2014-11-10 NOTE — Progress Notes (Signed)
Patient ID: Tara Welch, female   DOB: 1921/08/25, 81 y.o.   MRN: [id number]                       FACILITY: [facility]                       LEVEL OF CARE:   SNF   CHIEF COMPLAINT:  Acute visit secondary to poor appetite failure to thrive     HISTORY OF PRESENT ILLNESS:  This is a 81 year-old woman who was living at home with her sonfairly independently, per her description.    She was brought to the hospital with altered mental status and left-sided weakness.  She was found to have a large subacute infarct in the right middle cerebral artery territory.  She went on to develop dense hemiplegia on the left and severe left-sided neglect.  She was seen by Neurology, who recommended aspirin.  She was started on a statin since her LDL was greater than 70.  Echocardiogram and carotid dopplers were negative.    She developed significant bradycardia.  Her Toprol XL was discontinued.    The patient was seen in hospital by Palliative Care.   .   It was felt that her long-term prognosis was poor.    apparently though family did not want hospice referral although this is somewhat unclear in fact it appears she is now under guardianship with desire to eventually decide who will be  her responsible party   yOther medical issues included hypokalemia in the hospital, although this was aggressively corrected.  Her potassium was 5 on 10/22/2014 suspect potassium showed it was starting to trend down again --Dr.Robson has started her on low-dose potassium labs yesterday showed a potassium of 3.9  Apparently she is having quite a poor appetite-speech  therapy has evaluated her and a swallowing study is pending.  According to speech therapy she actually has eaten fairly well the last couple days however she has had significant weight loss it appears about 9 pounds over the past 3 weeks although appears to have stabilized over the last couple weeks  She is on Magic cup 3 times a day.  Patient  is denying any abdominal issues nausea or vomiting or swallow concerns affecting her eating... when I saw with her her main complaint is she wants to get home says she'll eat better when she gets home       PAST MEDICAL HISTORY/PROBLEM LIST:           Right breast cancer.    Thyroid disease.    PAST SURGICAL HISTORY:             Right breast reconstruction.    Eye surgery.    CURRENT MEDICATIONS:  Discharge medications include:     Aspirin 325 q.d.     Lipitor 10 q.d.        Synthroid 50 q.d. (TSH checked in the hospital at 2.784).      SOCIAL HISTORY:   As mentioned, the patient did not accept Hospice care.  In fact, she is still a Full Code in the facility.   FUNCTIONAL STATUS:  Per the patient, she was independent with ADLs.  She is on a pureed diet with honey-thick liquids.    REVIEW OF SYSTEMS Gen. no complaints of fever or chills.  Head ears eyes nose mouth and throat does have visual changes status post CVA does not complain  of any sore throat or what she feels  is any difficulty swallowing:            CHEST/RESPIRATORY:  No shortness of breath.or cough     CARDIAC:  No chest pain.    GI:   No nausea, vomiting, or abdominal pain.   GU:  She has a Foley catheter in place.  Muscle skeletal continues with significant left hemiparesis.  Neurologic as noted above.     PHYSICAL EXAMINATION: Temperature is 97.1 pulse 77 respirations 20 blood pressure 124/66 O2 saturation 94% on room air    GENERAL APPEARANCE:  Pleasant, elderly woman in no distress. Comfortably in her wheelchair.  Her skin is warm and dry   CHEST/RESPIRATORY:  Clear air entry bilaterally.     CARDIOVASCULAR:   CARDIAC:  Heart sounds are normal.  She appears to be euvolemic regular rate and rhythm at 90.   GASTROINTESTINAL:   ABDOMEN:  Slightly distended, but bowel sounds are positive.  There is no tenderness.     LIVER/SPLEEN/KIDNEY:   No liver, no spleen.  No tenderness.    GENITOURINARY:     BLADDER:  Again, Foley catheter in place.   CIRCULATION:   EDEMA/VARICOSITIES:  Extremities:  No edema.   NEUROLOGICAL:   CRANIAL NERVES:  She has a right upper motor neuron 7th, a left homonymous hemianopsia, and severe left neglect.   Continues with dense left-sided hemiparalysis  Psych-she is pleasant and appropriate-per nursing staff has episodes of confusion again she did receive a psychiatric evaluation today to evaluate competency to make decisions  \Labs.  11/09/2014.  Sodium 136 potassium 3.9 BUN 21 creatinine 0.57.  11/03/2014.  Sodium 139 potassium 4.2 BUN 21 creatinine 0.64.  11/01/2014.  Liver function tests within normal limits except AST of 44-albumen of 3.0.  10/19/2014.  WBC 7.5 hemoglobin 11.4 platelets 260      ASSESSMENT/PLAN:                        Extensive right middle cerebral artery stroke.  As noted, she has dense left hemiparesis and left homonymous hemianopsia most likely, as well as severe left neglect.  This does not bode well for any form of functional recovery.  She was approached by Palliative Care in the hospital.  but apparently deferred --- she is currently under guardianship .    Appears actually to be in good spirits but her weight loss and poor appetite continues to be concerned     -speech therapy is following her closely and has ordered a swallow study.      Also will start Remeron 7.5 mg daily to help hopefully with her appetite.--This was discussed with Dr. Dellia Nims  I did discuss patient's status with Colletta Maryland [place]--currently she is her guardian from the Department of Social Services.  If her appetite does not improve she may be a candidate for a feeding tube-however will need input from responsible party on aggressiveness of care.  Colletta Maryland did express understanding and apparently patient is getting a psychiatric evaluation today to measure her competency to make her own decisions.  Again there is a transitory state  here regarding her responsible party-- at this point will continue to communicate with her guardian about her status.  We will have to see how she responds to the appetite stimulant and what her appetite and swallow studies tell us.  Clinically she does not appear to be unstable her labs actually appear to be stable but this  will have to be monitored closely  Will update a CBC and metabolic panel on Friday                                                                                                                                                                                                                 .   Bradycardia.   .  This appears to have resolved off the Toprol.--Recent pulses. Run from the 70-90s    On statin for secondary prevention.      Hypokalemia.  This was corrected. Again will update metabolic panel later this week         Hypothyroidism.  On replacement.   Her TSH was checked in the hospital and was normal.           CPT CODE: 99310--of note greater than 35 minutes spent assessing patient-discussing her status with nursing staff-as well as with her guardian via phone-and formulating a plan of care as well as reviewing her chart.  Note greater than 50% of time spent coordinating plan of care including input from guardian

## 2014-11-12 ENCOUNTER — Other Ambulatory Visit (HOSPITAL_COMMUNITY)
Admission: AD | Admit: 2014-11-12 | Discharge: 2014-11-12 | Disposition: A | Discharge status: Home / Self Care | Payer: No Typology Code available for payment source | Source: Skilled Nursing Facility | Attending: Internal Medicine | Admitting: Internal Medicine

## 2014-11-12 DIAGNOSIS — A419 Sepsis, unspecified organism: Secondary | ICD-10-CM | POA: Insufficient documentation

## 2014-11-12 LAB — BASIC METABOLIC PANEL
Anion gap: 8 (ref 5–15)
BUN: 17 mg/dL (ref 6–20)
CALCIUM: 8.7 mg/dL — AB (ref 8.9–10.3)
CO2: 25 mmol/L (ref 22–32)
Chloride: 106 mmol/L (ref 101–111)
Creatinine, Ser: 0.52 mg/dL (ref 0.44–1.00)
GLUCOSE: 100 mg/dL — AB (ref 65–99)
POTASSIUM: 3.9 mmol/L (ref 3.5–5.1)
SODIUM: 139 mmol/L (ref 135–145)

## 2014-11-12 LAB — CBC
HCT: 31.4 % — ABNORMAL LOW (ref 36.0–46.0)
Hemoglobin: 10.2 g/dL — ABNORMAL LOW (ref 12.0–15.0)
MCH: 31.5 pg (ref 26.0–34.0)
MCHC: 32.5 g/dL (ref 30.0–36.0)
MCV: 96.9 fL (ref 78.0–100.0)
PLATELETS: 228 10*3/uL (ref 150–400)
RBC: 3.24 MIL/uL — AB (ref 3.87–5.11)
RDW: 15 % (ref 11.5–15.5)
WBC: 6.7 10*3/uL (ref 4.0–10.5)

## 2014-11-12 DEATH — deceased

## 2014-11-19 ENCOUNTER — Encounter (HOSPITAL_COMMUNITY)
Admission: AD | Admit: 2014-11-19 | Discharge: 2014-11-19 | Disposition: A | Discharge status: Home / Self Care | Payer: Medicare Other | Source: Skilled Nursing Facility | Attending: Internal Medicine | Admitting: Internal Medicine

## 2014-11-19 DIAGNOSIS — R3989 Other symptoms and signs involving the genitourinary system: Secondary | ICD-10-CM | POA: Insufficient documentation

## 2014-11-19 LAB — URINE MICROSCOPIC-ADD ON

## 2014-11-19 LAB — URINALYSIS, ROUTINE W REFLEX MICROSCOPIC
Bilirubin Urine: NEGATIVE
Glucose, UA: NEGATIVE mg/dL
Ketones, ur: NEGATIVE mg/dL
Nitrite: POSITIVE — AB
Protein, ur: >300 mg/dL — AB
Urobilinogen, UA: 1 mg/dL (ref 0.0–1.0)
pH: 6 (ref 5.0–8.0)

## 2014-11-22 ENCOUNTER — Ambulatory Visit (HOSPITAL_COMMUNITY): Payer: Medicare Other | Attending: Internal Medicine | Admitting: Speech Pathology

## 2014-11-22 ENCOUNTER — Ambulatory Visit (HOSPITAL_COMMUNITY)
Admit: 2014-11-22 | Discharge: 2014-11-22 | Disposition: A | Discharge status: Home / Self Care | Payer: No Typology Code available for payment source | Source: Skilled Nursing Facility | Attending: Internal Medicine | Admitting: Internal Medicine

## 2014-11-22 DIAGNOSIS — R1310 Dysphagia, unspecified: Secondary | ICD-10-CM | POA: Diagnosis present

## 2014-11-22 DIAGNOSIS — R1314 Dysphagia, pharyngoesophageal phase: Secondary | ICD-10-CM | POA: Insufficient documentation

## 2014-11-22 NOTE — Therapy (Signed)
[place], [place], [address] Phone: [phone]   Fax:  [phone]  Modified Barium Swallow  Patient Details  Name: Tara Welch MRN: [id number] Date of Birth: 1921-07-07 Referring Provider:  Ricard Dillon, MD  Encounter Date: 11/22/2014      End of Session - 11/22/14 2308    Visit Number 1   Number of Visits 1   Authorization Type Medicare   SLP Start Time 1310   SLP Stop Time  0375   SLP Time Calculation (min) 33 min   Activity Tolerance Patient tolerated treatment well      Past Medical History  Diagnosis Date  . Cancer     right breast  . Thyroid disease     Past Surgical History  Procedure Laterality Date  . Breast reconstruction Right   . Eye surgery      There were no vitals filed for this visit.  Visit Diagnosis: Dysphagia, pharyngoesophageal phase           General - 11/22/14 2300    General Information   Date of Onset 10/11/14   Other Pertinent Information Tara Welch sustained right MCA territory infarct at the end of May 2016 and was placed on D1/puree with HTL prior to discharge to [facility]. Pt now referred for objective study to evaluate swallow function. She has been consuming puree with HTL with trials of mech soft and thin.   Type of Study Other (Comment)  MBSS   Respiratory Status Room air   Behavior/Cognition Lethargic/Drowsy;Requires cueing;Pleasant mood   Oral Cavity - Dentition Adequate natural dentition/normal for age   Oral Motor / Sensory Function Impaired   Self-Feeding Abilities Able to feed self;Needs set up  decreased left inattention   Patient Positioning Upright in chair/Tumbleform   Baseline Vocal Quality Normal   Volitional Cough Strong   Volitional Swallow Able to elicit   Anatomy Within functional limits   Pharyngeal Secretions Not observed secondary MBS            Oral Preparation/Oral Phase - 11/22/14 2303    Oral Preparation/Oral Phase    Oral Phase Impaired   Oral - Nectar   Oral - Nectar Cup Left anterior bolus loss   Oral - Thin   Oral - Thin Cup Left anterior bolus loss   Oral - Solids   Oral - Mechanical Soft Delayed A-P transit;Piecemeal swallowing;Weak ligual manipulation   Electrical stimulation - Oral Phase   Was Electrical Stimulation Used No          Pharyngeal Phase - 11/22/14 2304    Pharyngeal Phase   Pharyngeal Phase Impaired   Pharyngeal - Nectar   Pharyngeal- Nectar Cup Swallow initiation at vallecula;Delayed swallow initiation;Penetration/Aspiration during swallow   Pharyngeal- Nectar Straw Swallow initiation at vallecula;Swallow initiation at pyriform sinus;Delayed swallow initiation;Penetration/Aspiration during swallow   Pharyngeal - Solids   Pharyngeal- Puree Swallow initiation at vallecula;Swallow initiation at pyriform sinus;Delayed swallow initiation;Penetration/Aspiration during swallow;Penetration/Apiration after swallow;Trace aspiration   Pharyngeal- Mechanical Soft Within functional limits   Pharyngeal- Pill Not tested   Electrical Stimulation - Pharyngeal Phase   Was Electrical Stimulation Used No          Cricopharyngeal Phase - 11/22/14 2307    Cervical Esophageal Phase   Cervical Esophageal Phase Within functional limits                  Plan - 11/22/14 2309    Clinical Impression Statement  Pt presents with mild/mod sensorimotor based dysphagia characterized by inefficient oral prep with poor labial closure (left side) and weak lingual manipulation due to weakness; pharyngeal phase is marked by premature spillage to the valleculae with delay in swallow initiation triggering after spilling to pyriforms with straw sips nectar-thick liquid. Pt with flash penetration (underepiglottic coating) with nectars and trace, but silent aspiration of thins during the swallow. No cough responses was elicited, however vocal quality sounded mildly wet. Pt cued to cough to clear  aspirate. Puree and mechanical soft textures were [facility] for pharyngeal phase of swallow. Esophageal sweep was unremarkable. Recommend D2/fine chop with SLP to upgrade to mech soft as clinically appropriate and nectar-thick liquids. Cue pt to clear throat periodically.          G-Codes - December 16, 2014 09/04/09    Functional Assessment Tool Used MBSS; clinical judgement   Functional Limitations Swallowing   Swallow Current Status (K5625) At least 40 percent but less than 60 percent impaired, limited or restricted   Swallow Goal Status (W3893) At least 1 percent but less than 20 percent impaired, limited or restricted   Swallow Discharge Status [phone]) At least 40 percent but less than 60 percent impaired, limited or restricted          Recommendations/Treatment - Dec 16, 2014 September 04, 2305    Swallow Evaluation Recommendations   SLP Diet Recommendations Dysphagia 2 (Fine chop);Nectar  trials mech soft and upgrade clinically when appropriate   Liquid Administration via Cup   Medication Administration Crushed with puree   Supervision Full supervision/cueing for compensatory strategies;Staff to assist with self feeding   Compensations Externally pace;Minimize environmental distractions;Slow rate;Small sips/bites;Check for pocketing;Check for anterior loss;Multiple dry swallows after each bite/sip;Follow solids with liquid;Effortful swallow;Clear throat intermittently   Postural Changes Remain semi-upright after after feeds/meals (Comment);Seated upright at 90 degrees          Prognosis - 12-16-2014 September 05, 2306    Prognosis   Prognosis for Safe Diet Advancement Fair   Barriers to Reach Goals Cognitive deficits;Severity of deficits   Individuals Consulted   Consulted and Agree with Results and Recommendations Patient   Report Sent to  Referring physician;Facility (Comment)      Problem List Patient Active Problem List   Diagnosis Date Noted  . Poor appetite 11/10/2014  . Palliative care encounter   .  Dysphagia, pharyngoesophageal phase 10/14/2014  . Left hemiplegia 10/14/2014  . Elevated troponin 10/14/2014  . Bradycardia 10/14/2014  . Acute CVA (cerebrovascular accident) 10/11/2014   Thank you,  Genene Churn, [place]  [facility] 2014-12-16, 11:11 PM  [place] [address] [place], [place], [address] Phone: [phone]   Fax:  [phone]

## 2014-11-24 ENCOUNTER — Encounter: Payer: Self-pay | Admitting: Internal Medicine

## 2014-11-24 DIAGNOSIS — G8194 Hemiplegia, unspecified affecting left nondominant side: Secondary | ICD-10-CM

## 2014-11-24 DIAGNOSIS — R1314 Dysphagia, pharyngoesophageal phase: Secondary | ICD-10-CM

## 2014-11-24 DIAGNOSIS — N10 Acute tubulo-interstitial nephritis: Secondary | ICD-10-CM

## 2014-11-24 DIAGNOSIS — N390 Urinary tract infection, site not specified: Secondary | ICD-10-CM | POA: Insufficient documentation

## 2014-11-24 DIAGNOSIS — R630 Anorexia: Secondary | ICD-10-CM

## 2014-11-24 LAB — URINE CULTURE

## 2014-11-24 NOTE — Progress Notes (Signed)
Patient ID: Tara Welch, female   DOB: 1921-09-13, 81 y.o.   MRN: [id number]                        FACILITY: [facility]                       LEVEL OF CARE:   SNF   CHIEF COMPLAINT:  Acute visit secondary to UTI-follow-up poor appetite     HISTORY OF PRESENT ILLNESS:  This is a 81 year-old woman who was living at home with her sonfairly independently, per her description.    She was brought to the hospital with altered mental status and left-sided weakness.  She was found to have a large subacute infarct in the right middle cerebral artery territory.  She went on to develop dense hemiplegia on the left and severe left-sided neglect.  She was seen by Neurology, who recommended aspirin.  She was started on a statin since her LDL was greater than 70.  Echocardiogram and carotid dopplers were negative.    She developed significant bradycardia.  Her Toprol XL was discontinued.    The patient was seen in hospital by Palliative Care.   .   It was felt that her long-term prognosis was poor.    apparently though family did not want hospice referral although this is somewhat unclear in fact it appears she is now under guardianship with desire to eventually decide who will be  her responsible party   yOther medical issues included hypokalemia in the hospital, although this was aggressively corrected.  Her potassium was 5 on 10/22/2014 suspect potassium showed it was starting to trend down again --Dr.Robson has started her on low-dose potassium labs on July 1 show potassium is stable at 3.9  She has been seen for poor appetite and started on Remeron-her weight appears to be relatively stable the past 3 weeks currently 113  She is on Magic cup supplementation.   She has been seen by speech therapy and actually had a swallow study on July 11 with recommendation for dysphagia to mechanical soft diet. Her vision-this study showed silent aspiration with thin liquids flash penetration  with nectar thick liquids   Patient recently had a urine obtained apparently secondary to some increased confusion it has come back positive for greater than 100,000 colonies of Escherichia coli this pansensitive.  Currently she is afebrile vital signs are stable       PAST MEDICAL HISTORY/PROBLEM LIST:           Right breast cancer.    Thyroid disease.    PAST SURGICAL HISTORY:             Right breast reconstruction.    Eye surgery.    CURRENT MEDICATIONS:  Discharge medications include:     Aspirin 325 q.d.     Lipitor 10 q.d.        Synthroid 50 q.d. (TSH checked in the hospital at 2.784).      SOCIAL HISTORY:   As mentioned, the patient did not accept Hospice care.  In fact, she is still a Full Code in the facility.   FUNCTIONAL STATUS:  Per the patient, she was independent with ADLs.  She is on a pureed diet with honey-thick liquids.    REVIEW OF SYSTEMS Gen. no complaints of fever or chills.  Head ears eyes nose mouth and throat does have visual changes status post CVA does  not complain of any sore throat or what she feels  is any difficulty swallowing:            CHEST/RESPIRATORY:  No shortness of breath.or cough     CARDIAC:  No chest pain.    GI:   No nausea, vomiting, or abdominal pain.   GU:  She has a Foley catheter in place.-Appears to have a UTI  Muscle skeletal continues with significant left hemiparesis.  Neurologic as noted above.     PHYSICAL EXAMINATION: Temperature is 97.7 pulse 90 respirations 20 blood pressure 116/54 weight is 113    GENERAL APPEARANCE:  Pleasant, elderly woman in no distress. Comfortably in her wheelchair.  Her skin is warm and dry   CHEST/RESPIRATORY:  Clear air entry bilaterally.     CARDIOVASCULAR:   CARDIAC:  Heart sounds are normal.  She appears to be euvolemic regular rate and rhythm at 90.   GASTROINTESTINAL:   ABDOMEN:  Slightly distended, but bowel sounds are positive.  There is no tenderness.       LIVER/SPLEEN/KIDNEY:   No liver, no spleen.  No tenderness.    GENITOURINARY:   BLADDER:  Again, Foley catheter in place.   CIRCULATION:   EDEMA/VARICOSITIES:  Extremities:  No edema.   NEUROLOGICAL:   CRANIAL NERVES:  She has a right upper motor neuron 7th, a left homonymous hemianopsia, and severe left neglect.   Continues with dense left-sided hemiparalysis  Psych-she is pleasant and appropriate-per nursing staff has episodes of confusion again she did receive a psychiatric evaluation today to evaluate competency to make decisions  \Labs.  11/22/2014.  Sodium 139 potassium 3.9 BUN 17 creatinine 0.52.  WBC 6.7 hemoglobin 10.2 platelets 228  11/09/2014.  Sodium 136 potassium 3.9 BUN 21 creatinine 0.57.  11/03/2014.  Sodium 139 potassium 4.2 BUN 21 creatinine 0.64.  11/01/2014.  Liver function tests within normal limits except AST of 44-albumin of 3.0.  10/19/2014.  WBC 7.5 hemoglobin 11.4 platelets 260      ASSESSMENT/PLAN:     UTI-positive for greater than 100,000 colonies Escherichia coli-this was pansensitive will start her on amoxicillin 100 mg twice a day for 7 days and monitor                      Extensive right middle cerebral artery stroke.  As noted, she has dense left hemiparesis and left homonymous hemianopsia most likely, as well as severe left neglect.  This does not bode well for any form of functional recovery.  She was approached by Palliative Care in the hospital.  but apparently deferred --- she is currently under guardianship .    Appears actually to be in good spirits but her weight loss and poor appetite continues to be concerned     -speech therapy is following her closely and has ordered a swallow study.      Also will start Remeron 7.5 mg daily to help hopefully with her appetite.--This was discussed with Dr. Dellia Nims  I did discuss patient's status with Colletta Maryland [place]--currently she is her guardian from the Department of [employer].  If her appetite does not improve she may be a candidate for a feeding tube-however will need input from responsible party on aggressiveness of care.  Colletta Maryland did express understanding and apparently patient is getting a psychiatric evaluation today to measure her competency to make her own decisions.  Again there is a transitory state here regarding her responsible party-- at this point will continue  to communicate with her guardian about her status.  We will have to see how she responds to the appetite stimulant and what her appetite and swallow studies tell us.  Clinically she does not appear to be unstable her labs actually appear to be stable but this will have to be monitored closely                                                                                                                                                                                                                  .   Bradycardia.   .  This appears to have resolved off the Toprol.--Recent pulses. Run from the 80-90s    On statin for secondary prevention.      Hypokalemia.  This was corrected. --We'll update BMP         Hypothyroidism.  On replacement.   Her TSH was checked in the hospital and was normal.           CPT CODE: 99310--of note greater than 35 minutes spent assessing patient-discussing her status with nursing staff-as well as with her guardian via phone-and formulating a plan of care as well as reviewing her chart.  Note greater than 50% of time spent coordinating plan of care including input from guardian                        This encounter was created in error - please disregard.

## 2014-11-25 ENCOUNTER — Non-Acute Institutional Stay (SKILLED_NURSING_FACILITY): Payer: Medicare Other | Admitting: Internal Medicine

## 2014-11-25 DIAGNOSIS — R634 Abnormal weight loss: Secondary | ICD-10-CM | POA: Diagnosis not present

## 2014-11-25 DIAGNOSIS — I69391 Dysphagia following cerebral infarction: Secondary | ICD-10-CM | POA: Diagnosis not present

## 2014-11-25 DIAGNOSIS — N390 Urinary tract infection, site not specified: Secondary | ICD-10-CM

## 2014-11-25 DIAGNOSIS — T8351XD Infection and inflammatory reaction due to indwelling urinary catheter, subsequent encounter: Secondary | ICD-10-CM

## 2014-11-25 DIAGNOSIS — T83511D Infection and inflammatory reaction due to indwelling urethral catheter, subsequent encounter: Secondary | ICD-10-CM

## 2014-11-25 LAB — CBC WITH DIFFERENTIAL/PLATELET
BASOS ABS: 0 10*3/uL (ref 0.0–0.1)
Basophils Relative: 0 % (ref 0–1)
EOS PCT: 2 % (ref 0–5)
Eosinophils Absolute: 0.1 10*3/uL (ref 0.0–0.7)
HEMATOCRIT: 32.2 % — AB (ref 36.0–46.0)
HEMOGLOBIN: 10.4 g/dL — AB (ref 12.0–15.0)
Lymphocytes Relative: 23 % (ref 12–46)
Lymphs Abs: 1.1 10*3/uL (ref 0.7–4.0)
MCH: 31.9 pg (ref 26.0–34.0)
MCHC: 32.3 g/dL (ref 30.0–36.0)
MCV: 98.8 fL (ref 78.0–100.0)
MONOS PCT: 9 % (ref 3–12)
Monocytes Absolute: 0.4 10*3/uL (ref 0.1–1.0)
NEUTROS PCT: 66 % (ref 43–77)
Neutro Abs: 3.1 10*3/uL (ref 1.7–7.7)
Platelets: 241 10*3/uL (ref 150–400)
RBC: 3.26 MIL/uL — ABNORMAL LOW (ref 3.87–5.11)
RDW: 15.5 % (ref 11.5–15.5)
WBC: 4.7 10*3/uL (ref 4.0–10.5)

## 2014-11-25 LAB — COMPREHENSIVE METABOLIC PANEL
ALK PHOS: 73 U/L (ref 38–126)
ALT: 19 U/L (ref 14–54)
AST: 25 U/L (ref 15–41)
Albumin: 3.6 g/dL (ref 3.5–5.0)
Anion gap: 7 (ref 5–15)
BILIRUBIN TOTAL: 0.5 mg/dL (ref 0.3–1.2)
BUN: 19 mg/dL (ref 6–20)
CHLORIDE: 107 mmol/L (ref 101–111)
CO2: 24 mmol/L (ref 22–32)
Calcium: 8.8 mg/dL — ABNORMAL LOW (ref 8.9–10.3)
Creatinine, Ser: 0.54 mg/dL (ref 0.44–1.00)
GFR calc Af Amer: >60 mL/min (ref >60)
GFR calc non Af Amer: >60 mL/min (ref >60)
Glucose, Bld: 93 mg/dL (ref 65–99)
Potassium: 3.9 mmol/L (ref 3.5–5.1)
Sodium: 138 mmol/L (ref 135–145)
TOTAL PROTEIN: 6.7 g/dL (ref 6.5–8.1)

## 2014-11-29 LAB — COMPREHENSIVE METABOLIC PANEL
ALBUMIN: 3.6 g/dL (ref 3.5–5.0)
ALK PHOS: 77 U/L (ref 38–126)
ALT: 20 U/L (ref 14–54)
ANION GAP: 8 (ref 5–15)
AST: 24 U/L (ref 15–41)
BILIRUBIN TOTAL: 0.5 mg/dL (ref 0.3–1.2)
BUN: 16 mg/dL (ref 6–20)
CO2: 28 mmol/L (ref 22–32)
CREATININE: 0.65 mg/dL (ref 0.44–1.00)
Calcium: 9.2 mg/dL (ref 8.9–10.3)
Chloride: 104 mmol/L (ref 101–111)
GFR calc Af Amer: >60 mL/min (ref >60)
GFR calc non Af Amer: >60 mL/min (ref >60)
GLUCOSE: 109 mg/dL — AB (ref 65–99)
POTASSIUM: 3.7 mmol/L (ref 3.5–5.1)
SODIUM: 140 mmol/L (ref 135–145)
Total Protein: 6.8 g/dL (ref 6.5–8.1)

## 2014-11-30 NOTE — Progress Notes (Addendum)
Patient ID: Tara Welch, female   DOB: 09-Jul-1921, 81 y.o.   MRN: [id number]                PROGRESS NOTE  DATE:  11/24/2014         FACILITY: [facility]                      LEVEL OF CARE:   SNF   Acute Visit                CHIEF COMPLAINT:  Declining oral intake.      HISTORY OF PRESENT ILLNESS:  This is a patient who had a large non-dominant hemisphere CVA involving the temporal, frontal, and parietal lobes.  She had left hemiparesis and extreme left neglect when she came in, although I think she has made some improvements here.    Apparently, her oral intake has been marginal.  Her swallowing study showed some penetration.  She has not lost a lot of weight, perhaps three pounds.    She was also observed to have some mental status changes, making odd statements.  A urine culture was done that showed a reasonably sensitive E.coli for which she is now on amoxicillin.    LABORATORY DATA:   Most recent lab work on 11/12/2014:      BUN 17, creatinine 0.52.    White count  6.7, hemoglobin 10.2.      Review of Systems: Resp: no cough no sputum. She is working with speech therapy.  CVS No chest pain Neurologic: no headache . No new weakness or change in sensation  PHYSICAL EXAMINATION:   GENERAL APPEARANCE:  The patient's left neglect is actually some better than when she came in, but the hemiparesis is about the same.   CHEST/RESPIRATORY:  Clear air entry bilaterally.    CARDIOVASCULAR:   CARDIAC:  She looks somewhat volume-contracted to me.    GASTROINTESTINAL:   LIVER/SPLEEN/KIDNEYS:  No liver, no spleen.  No tenderness.     NEUROLOGICAL:   No major change.     ASSESSMENT/PLAN:                  Large non-dominant hemisphere stroke with left hemiparesis, dysphagia, probably homonymous hemianopsia, and extreme neglect.  There has been some marginal improvement in the neglect issue.   I worry about her overall hydration status here.  I have ordered a comprehensive  metabolic panel for Monday to look at her BUN and creatinine as well as her albumin.  If any of these start declining, we will need to talk to family about how much support to give this unfortunate, elderly woman.  I note that she was put on Remeron, probably as an appetite stimulant, late last month at 7.5 mg.    E.coli UTI.  It is unclear whether this was a true infection with secondary mental status changes or not (delirium).  Nevertheless, it certain indicated to treat this.    CVA related dysphagia; i have discussed this in detail with the speech therapist at the bedside. She does not have any overt  Aspiration.   Weight loss: This is mild and by itself does not justify a Peg tube at this point  Post stroke depression: I think this is actually better.   Comprehensive metabolic panel ordered for next Monday.  We will make a decision at that point whether to contact the family.     CPT CODE: 14970

## 2014-12-03 ENCOUNTER — Non-Acute Institutional Stay (SKILLED_NURSING_FACILITY): Payer: Medicare Other | Admitting: Internal Medicine

## 2014-12-03 ENCOUNTER — Encounter: Payer: Self-pay | Admitting: Internal Medicine

## 2014-12-03 DIAGNOSIS — I639 Cerebral infarction, unspecified: Secondary | ICD-10-CM | POA: Diagnosis not present

## 2014-12-03 DIAGNOSIS — R630 Anorexia: Secondary | ICD-10-CM | POA: Diagnosis not present

## 2014-12-03 DIAGNOSIS — G8190 Hemiplegia, unspecified affecting unspecified side: Secondary | ICD-10-CM | POA: Diagnosis not present

## 2014-12-03 DIAGNOSIS — G8194 Hemiplegia, unspecified affecting left nondominant side: Secondary | ICD-10-CM

## 2014-12-03 DIAGNOSIS — R1314 Dysphagia, pharyngoesophageal phase: Secondary | ICD-10-CM

## 2014-12-03 LAB — CBC WITH DIFFERENTIAL/PLATELET
Basophils Absolute: 0 10*3/uL (ref 0.0–0.1)
Basophils Relative: 0 % (ref 0–1)
Eosinophils Absolute: 0.2 10*3/uL (ref 0.0–0.7)
Eosinophils Relative: 3 % (ref 0–5)
HCT: 33.1 % — ABNORMAL LOW (ref 36.0–46.0)
HEMOGLOBIN: 10.5 g/dL — AB (ref 12.0–15.0)
LYMPHS PCT: 19 % (ref 12–46)
Lymphs Abs: 1.1 10*3/uL (ref 0.7–4.0)
MCH: 31.8 pg (ref 26.0–34.0)
MCHC: 31.7 g/dL (ref 30.0–36.0)
MCV: 100.3 fL — ABNORMAL HIGH (ref 78.0–100.0)
MONOS PCT: 12 % (ref 3–12)
Monocytes Absolute: 0.7 10*3/uL (ref 0.1–1.0)
NEUTROS ABS: 3.9 10*3/uL (ref 1.7–7.7)
Neutrophils Relative %: 66 % (ref 43–77)
PLATELETS: 275 10*3/uL (ref 150–400)
RBC: 3.3 MIL/uL — AB (ref 3.87–5.11)
RDW: 15.4 % (ref 11.5–15.5)
WBC: 5.9 10*3/uL (ref 4.0–10.5)

## 2014-12-03 LAB — COMPREHENSIVE METABOLIC PANEL
ALT: 17 U/L (ref 14–54)
ANION GAP: 7 (ref 5–15)
AST: 22 U/L (ref 15–41)
Albumin: 3.5 g/dL (ref 3.5–5.0)
Alkaline Phosphatase: 68 U/L (ref 38–126)
BUN: 21 mg/dL — ABNORMAL HIGH (ref 6–20)
CO2: 26 mmol/L (ref 22–32)
Calcium: 8.9 mg/dL (ref 8.9–10.3)
Chloride: 103 mmol/L (ref 101–111)
Creatinine, Ser: 0.54 mg/dL (ref 0.44–1.00)
GFR calc Af Amer: >60 mL/min (ref >60)
GLUCOSE: 126 mg/dL — AB (ref 65–99)
Potassium: 4.1 mmol/L (ref 3.5–5.1)
Sodium: 136 mmol/L (ref 135–145)
TOTAL PROTEIN: 6.6 g/dL (ref 6.5–8.1)
Total Bilirubin: 0.5 mg/dL (ref 0.3–1.2)

## 2014-12-03 NOTE — Progress Notes (Signed)
Patient ID: Tara Welch, female   DOB: October 13, 1921, 81 y.o.   MRN: [id number]   This is an acute visit.  Level care skilled.  Facility [facility].  Chief complaint acute visit follow-up weight loss declining oral intake.  History of present illness.  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  She continues with marginal by mouth intake swallowing study did show some penetration but her weight nonetheless appears to have stabilized at about 114-she is currently on honey thick liquids cough apparently on nectar thick liquids she is also on a pre-diet-she does not complain of any difficulty swallowing or abdominal discomfort.  She did have a urine culture recently done secondary to altered mental status this appears to have improved she has completed an anabolic.  Family medical social history has been reviewed per admission note on 10/18/2014.  Medications have been reviewed they are fairly minimal actually including aspirin 325 mg a day-atorvastatin 10 mg day-potassium 10 mEq a day-Remeron 7.5 mg daily at bedtime-and Synthroid 50 g daily-she is on Magic cup supplementation 3 times a day also on Tylenol 650 mg when necessary.  .  Review of systems again this is limited secondary to patient's relatively aphasic status obtained from patient and nursing.  In general no complaints of fever or chills.  Head ears eyes nose mouth--does not complain of sore throat.  Respiratory no complaints of cough or shortness of breath currently.  Cardiac no complaint of chest pain.  GI does not complain of abdominal discomfort nausea or vomiting no diarrhea apparently occasionally has constipation per nursing.  GU again she is completing an anabolic for UTI this appears to be stable.  Her logic again history of significant CVA with  left-sided neglect does not complain of headache or dizziness.  Physical exam.  Temperature is 97.3 pulse 88 respirations 20 blood pressure 104/60 her weight is 114.1 this appears to be stable during the month of July.  In general this is a frail elderly female in no distress she is pleasant sitting in the wheelchair.  Her skin is warm and dry skin turgor does not appear to be grossly impaired.  Oropharynx is clear mucous membranes appear fairly moist.  Chest is clear to auscultation with somewhat shallow air entry.  Heart is regular rate and rhythm without murmur gallop or rub she does not have significant lower extremity edema.  Abdomen is soft nontender positive bowel sounds.  Muscle skeletal she continues with left-sided hemiparalysis she does have a splint applied to her left arm and it is elevated . Neurologic as noted above  Psych she is grossly conversant although there are periods of confusion.  Labs.  11/25/2014.  Sodium 138 potassium 3.9 BUN 19 creatinine 0.54.  WBC 4.7 hemoglobin 10.4 platelets 241-albumen 3.6.  Assessment and plan.  #1-history of continued poor marginal oral intake-actually her weight appears to be stable she does not appear grossly dry will order stat labs however including a metabolic panel to assess this vital signs continued to be stable weights been stable for proximally 3 weeks--I have spoken with staff about encouraging fluids strongly and apparently they are doing this-she is on Remeron for appetite stimulation.  #2 history UTI she is finishing a course of amoxicillin this appears to be stable culture was positive for Escherichia coli.  CVA related to dysphagia-again she is on a modified  diet with honey thick liquids as well as per read-speech therapy is following her closely are attempting to greater when thought feasible.  [id number].  Addendum we have received the updated labs which actually are quite stable shows her creatinine is  0.54 BUN of 21-sodium 136 potassium 4.1 this is essentially stable with lab done earlier this month-her hemoglobin is 10.5 which shows stability as well white count 5.9 platelets 274.  I note her albumin is 3.5.    Marland Kitchen

## 2014-12-13 ENCOUNTER — Non-Acute Institutional Stay (SKILLED_NURSING_FACILITY): Payer: Medicare Other | Admitting: Internal Medicine

## 2014-12-13 ENCOUNTER — Other Ambulatory Visit (HOSPITAL_COMMUNITY)
Admission: AD | Admit: 2014-12-13 | Discharge: 2014-12-13 | Disposition: A | Discharge status: Home / Self Care | Payer: No Typology Code available for payment source | Source: Skilled Nursing Facility | Attending: Internal Medicine | Admitting: Internal Medicine

## 2014-12-13 DIAGNOSIS — I10 Essential (primary) hypertension: Secondary | ICD-10-CM | POA: Insufficient documentation

## 2014-12-13 DIAGNOSIS — G8194 Hemiplegia, unspecified affecting left nondominant side: Secondary | ICD-10-CM

## 2014-12-13 DIAGNOSIS — R1314 Dysphagia, pharyngoesophageal phase: Secondary | ICD-10-CM | POA: Diagnosis not present

## 2014-12-13 DIAGNOSIS — G8190 Hemiplegia, unspecified affecting unspecified side: Secondary | ICD-10-CM

## 2014-12-13 DIAGNOSIS — R630 Anorexia: Secondary | ICD-10-CM

## 2014-12-13 DIAGNOSIS — R300 Dysuria: Secondary | ICD-10-CM

## 2014-12-13 DIAGNOSIS — R8290 Unspecified abnormal findings in urine: Secondary | ICD-10-CM | POA: Insufficient documentation

## 2014-12-13 DIAGNOSIS — I639 Cerebral infarction, unspecified: Secondary | ICD-10-CM | POA: Diagnosis not present

## 2014-12-13 LAB — URINALYSIS W MICROSCOPIC (NOT AT ARMC)
BILIRUBIN URINE: NEGATIVE
Glucose, UA: NEGATIVE mg/dL
Ketones, ur: NEGATIVE mg/dL
NITRITE: POSITIVE — AB
Protein, ur: >300 mg/dL — AB
Specific Gravity, Urine: 1.01 (ref 1.005–1.030)
Urobilinogen, UA: 1 mg/dL (ref 0.0–1.0)
pH: 8.5 — ABNORMAL HIGH (ref 5.0–8.0)

## 2014-12-13 NOTE — Progress Notes (Signed)
Patient ID: LOTA LEAMER, female   DOB: Feb 05, 1922, 81 y.o.   MRN: [id number]     This is an acute visit.  Level care skilled.  Facility [facility].  Chief complaint acute visit follow-up weight loss--dysuria  History of present illness.  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  She continues with marginal by mouth intake swallowing study did show some penetration but her weight nonetheless appears to have stabilized at about 113-she is currently on honey thick liquids cough apparently on nectar thick liquids she is also on a pured-diet-she does not complain of any difficulty swallowing or abdominal discomfort.   She does have a history of UTIs and has completed antibiotic for the past apparently nursing staff feels she is having some dysuria and her urine has a very thickodorous presentation.  Family medical social history has been reviewed per admission note on 10/18/2014.  Medications have been reviewed they are fairly minimal actually including aspirin 325 mg a day-atorvastatin 10 mg day-potassium 10 mEq a day-Remeron 7.5 mg daily at bedtime-and Synthroid 50 g daily-she is on Magic cup supplementation 3 times a day also on Tylenol 650 mg when necessary.  .  Review of systems again this is limited secondary to patient's relatively aphasic status obtained from patient and nursing.  In general no complaints of fever or chills.  Head ears eyes nose mouth--does not complain of sore throat.  Respiratory no complaints of cough or shortness of breath currently.  Cardiac no complaint of chest pain.  GI does not complain of abdominal discomfort nausea or vomiting no diarrhea apparently occasionally has constipation per nursing.  GU urine symptoms as noted above.  Neuro--c again history of significant CVA with left-sided  neglect does not complain of headache or dizziness.  Physical exam.   Temperature 97.5 pulse 90 respirations 18 blood pressure 121/63 weight is 112.6  In general this is a frail elderly female in no distress she is pleasant sitting in the wheelchair.  Her skin is warm and dry skin turgor does not appear to be grossly impaired.  Oropharynx is clear mucous membranes appear fairly moist.  Chest is clear to auscultation with somewhat shallow air entry.  Heart is regular rate and rhythm without murmur gallop or rub she does not have significant lower extremity edema.  Abdomen is soft nontender positive bowel sounds  GU-appears to have some mild suprapubic tenderness I do not note any vaginal discharge.  Muscle skeletal she continues with left-sided hemiparalysis she does have a splint applied to her left arm and it is elevated . Neurologic as noted above  Psych she is grossly conversant although there are periods of confusion.  Labs.  12/03/2014.  WBC 5.9 hemoglobin 10.5 platelets 275.  Sodium 136 potassium 4.1 BUN 21 creatinine 0.54.  Albumin 3.5 liver function tests within normal limits  11/25/2014.  Sodium 138 potassium 3.9 BUN 19 creatinine 0.54.  WBC 4.7 hemoglobin 10.4 platelets 241-albumen 3.6.  Assessment and plan.  #1-history of continued poor marginal oral intake-actually her weight appears to be stable she does not appear grossly dry will orderlabs however including a metabolic panel to assess this vital signs continued to be stable weights been stable----I have spoken with staff about encouraging fluids strongly and apparently they are doing this-she is on Remeron for appetite stimulation.  #2 history UTI  It appears she  may have another one will obtain a UA C&S she is not febrile or give any septic presentation at this time.  CVA related to dysphagia-again she is on a modified diet with honey thick liquids as well as pureed--we are awaiting final decision on  a responsible party to discuss aggressiveness of care IE possible PEG tube at some point.  [id number].   Marland Kitchen    Marland Kitchen

## 2014-12-14 LAB — COMPREHENSIVE METABOLIC PANEL
ALK PHOS: 64 U/L (ref 38–126)
ALT: 25 U/L (ref 14–54)
ANION GAP: 8 (ref 5–15)
AST: 29 U/L (ref 15–41)
Albumin: 3.3 g/dL — ABNORMAL LOW (ref 3.5–5.0)
BILIRUBIN TOTAL: 0.4 mg/dL (ref 0.3–1.2)
BUN: 19 mg/dL (ref 6–20)
CHLORIDE: 104 mmol/L (ref 101–111)
CO2: 24 mmol/L (ref 22–32)
CREATININE: 0.57 mg/dL (ref 0.44–1.00)
Calcium: 8.9 mg/dL (ref 8.9–10.3)
GLUCOSE: 112 mg/dL — AB (ref 65–99)
Potassium: 3.9 mmol/L (ref 3.5–5.1)
Sodium: 136 mmol/L (ref 135–145)
TOTAL PROTEIN: 6.6 g/dL (ref 6.5–8.1)

## 2014-12-14 LAB — CBC WITH DIFFERENTIAL/PLATELET
Basophils Absolute: 0 10*3/uL (ref 0.0–0.1)
Basophils Relative: 0 % (ref 0–1)
EOS ABS: 0.2 10*3/uL (ref 0.0–0.7)
Eosinophils Relative: 3 % (ref 0–5)
HCT: 32 % — ABNORMAL LOW (ref 36.0–46.0)
Hemoglobin: 10.2 g/dL — ABNORMAL LOW (ref 12.0–15.0)
Lymphocytes Relative: 21 % (ref 12–46)
Lymphs Abs: 1.1 10*3/uL (ref 0.7–4.0)
MCH: 30.9 pg (ref 26.0–34.0)
MCHC: 31.9 g/dL (ref 30.0–36.0)
MCV: 97 fL (ref 78.0–100.0)
MONO ABS: 0.5 10*3/uL (ref 0.1–1.0)
Monocytes Relative: 11 % (ref 3–12)
NEUTROS PCT: 65 % (ref 43–77)
Neutro Abs: 3.2 10*3/uL (ref 1.7–7.7)
Platelets: 249 10*3/uL (ref 150–400)
RBC: 3.3 MIL/uL — ABNORMAL LOW (ref 3.87–5.11)
RDW: 14.4 % (ref 11.5–15.5)
WBC: 4.9 10*3/uL (ref 4.0–10.5)

## 2014-12-17 ENCOUNTER — Non-Acute Institutional Stay (SKILLED_NURSING_FACILITY): Payer: Medicare Other | Admitting: Internal Medicine

## 2014-12-17 DIAGNOSIS — T8351XS Infection and inflammatory reaction due to indwelling urinary catheter, sequela: Secondary | ICD-10-CM | POA: Diagnosis not present

## 2014-12-17 DIAGNOSIS — N390 Urinary tract infection, site not specified: Secondary | ICD-10-CM

## 2014-12-17 DIAGNOSIS — R630 Anorexia: Secondary | ICD-10-CM | POA: Diagnosis not present

## 2014-12-17 DIAGNOSIS — R1314 Dysphagia, pharyngoesophageal phase: Secondary | ICD-10-CM | POA: Diagnosis not present

## 2014-12-17 DIAGNOSIS — G8190 Hemiplegia, unspecified affecting unspecified side: Secondary | ICD-10-CM | POA: Diagnosis not present

## 2014-12-17 DIAGNOSIS — R634 Abnormal weight loss: Secondary | ICD-10-CM

## 2014-12-17 DIAGNOSIS — G8194 Hemiplegia, unspecified affecting left nondominant side: Secondary | ICD-10-CM

## 2014-12-17 DIAGNOSIS — T83511S Infection and inflammatory reaction due to indwelling urethral catheter, sequela: Secondary | ICD-10-CM

## 2014-12-17 LAB — URINE CULTURE: Culture: >=100000

## 2014-12-17 NOTE — Progress Notes (Signed)
Patient ID: Tara Welch, female   DOB: [birth year], 81 y.o.   MRN: [id number]      This is an acute visit.  Level care skilled.  Facility [facility].  Chief complaint acute visit follow-up weight loss--UTI  History of present illness.  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  She continues with marginal by mouth intake swallowing study did show some penetration--we will weight had stabilized however now appears to be slowly declining again currently 108.4 it appears earlier this week it was 110.8 and previously as been as high appears as around 116  she is currently on honey thick liquids cough apparently on nectar thick liquids she is also on a pured-diet-she does not complain of any difficulty swallowing or abdominal discomfort.   She does have a history of UTIs and has completed antibiotic for the past apparently nursing staff felt she was having dysuria and odorous urine in urine has grown out Escherichia coli and Proteus which is sensitive to amoxicillin which she has been started on.  Family medical social history has been reviewed per admission note on 10/18/2014.  Medications have been reviewed they are fairly minimal actually including aspirin 325 mg a day-atorvastatin 10 mg day-potassium 10 mEq a day-Remeron 7.5 mg daily at bedtime-and Synthroid 50 g daily-she is on Magic cup supplementation 3 times a day also on Tylenol 650 mg when necessary.  .  Review of systems again this is limited secondary to patient's relatively aphasic status obtained from patient and nursing.  In general no complaints of fever or chills.  Head ears eyes nose mouth--does not complain of sore throat.  Respiratory no complaints of cough or shortness of breath currently.  Cardiac no complaint of chest pain.  GI does not complain of  abdominal discomfort nausea or vomiting no diarrhea apparently occasionally has constipation per nursing.  GU urine symptoms as noted above.  Neuro--c again history of significant CVA with left-sided neglect does not complain of headache or dizziness.  Physical exam.   Temperature 98.1 pulse 76 respirations 20 blood pressure 126/63 weight is 108.4 this appears to be trending down  In general this is a frail elderly female in no distress she is pleasant sitting in the wheelchair.  Her skin is warm and dry skin turgor does not appear to be grossly impaired.  Oropharynx is clear mucous membranes appear fairly moist.  Chest is clear to auscultation with somewhat shallow air entry.  Heart is regular rate and rhythm without murmur gallop or rub she does not have significant lower extremity edema.  Abdomen is soft nontender positive bowel sounds  GU-minimal suprapubic tenderness to palpation and she has amber colored urine in the Foley.  Muscle skeletal she continues with left-sided hemiparalysis  . Neurologic as noted above  Psych she is grossly conversant although there are periods of confusion.  Labs  12/14/2014.  Sodium 136 potassium 3.9 BUN 19 creatinine 0.57.  Liver function tests within normal limits except albumin noted to be 3.3.  WBC 4.9 hemoglobin 10.2 platelets 249.  12/03/2014.  WBC 5.9 hemoglobin 10.5 platelets 275.  Sodium 136 potassium 4.1 BUN 21 creatinine 0.54.  Albumin 3.5 liver function tests within normal limits  11/25/2014.  Sodium 138 potassium 3.9 BUN 19 creatinine 0.54.  WBC 4.7 hemoglobin 10.4 platelets 241-albumen 3.6.  Assessment and plan.  #1-history of continued poor  marginal oral intake- Weight is now trending down which is a concern-she is on Remeron for appetite stimulation-her labs actually appear to be stable-I have left a message trying to speak with her responsible party about aggressiveness of care i.e. PEG tube? Will wait for  their input--continue to encourage by mouth supplementation  #2 history UTI   As noted above she has grown out Escherichia coli and Proteus greater than 100,000 colonies each-she is on amoxicillin which per sensitivity review should be effective does not appear to be slightly symptomatic today she is afebrile at this point monitor and continue the amoxicillin.  CVA related to dysphagia-again she is on a modified diet with honey thick liquids as well as pureed--we are awaiting final decision on a responsible party to discuss aggressiveness of care IE possible PEG tube at some point--he has gained some strength continues to be pleasant and appears to be in good spirits She continues on prophylactic aspirin 325 mg day.  [id number].--   .    Marland Kitchen

## 2014-12-20 ENCOUNTER — Non-Acute Institutional Stay (SKILLED_NURSING_FACILITY): Payer: Medicare Other | Admitting: Internal Medicine

## 2014-12-20 ENCOUNTER — Encounter (HOSPITAL_COMMUNITY)
Admission: RE | Admit: 2014-12-20 | Discharge: 2014-12-20 | Disposition: A | Discharge status: Home / Self Care | Payer: Medicare Other | Source: Skilled Nursing Facility | Attending: Internal Medicine | Admitting: Internal Medicine

## 2014-12-20 ENCOUNTER — Encounter: Payer: Self-pay | Admitting: Internal Medicine

## 2014-12-20 DIAGNOSIS — R634 Abnormal weight loss: Secondary | ICD-10-CM | POA: Diagnosis not present

## 2014-12-20 DIAGNOSIS — R630 Anorexia: Secondary | ICD-10-CM

## 2014-12-20 DIAGNOSIS — R1314 Dysphagia, pharyngoesophageal phase: Secondary | ICD-10-CM

## 2014-12-20 DIAGNOSIS — G8190 Hemiplegia, unspecified affecting unspecified side: Secondary | ICD-10-CM

## 2014-12-20 DIAGNOSIS — I639 Cerebral infarction, unspecified: Secondary | ICD-10-CM

## 2014-12-20 DIAGNOSIS — G8194 Hemiplegia, unspecified affecting left nondominant side: Secondary | ICD-10-CM

## 2014-12-20 LAB — BASIC METABOLIC PANEL
Anion gap: 9 (ref 5–15)
BUN: 13 mg/dL (ref 6–20)
CO2: 26 mmol/L (ref 22–32)
CREATININE: 0.52 mg/dL (ref 0.44–1.00)
Calcium: 9.1 mg/dL (ref 8.9–10.3)
Chloride: 103 mmol/L (ref 101–111)
GFR calc Af Amer: >60 mL/min (ref >60)
GFR calc non Af Amer: >60 mL/min (ref >60)
Glucose, Bld: 116 mg/dL — ABNORMAL HIGH (ref 65–99)
Potassium: 3.8 mmol/L (ref 3.5–5.1)
Sodium: 138 mmol/L (ref 135–145)

## 2014-12-27 ENCOUNTER — Encounter (HOSPITAL_COMMUNITY)
Admission: RE | Admit: 2014-12-27 | Discharge: 2014-12-27 | Disposition: A | Discharge status: Home / Self Care | Payer: Medicare Other | Source: Skilled Nursing Facility | Attending: Internal Medicine | Admitting: Internal Medicine

## 2014-12-27 LAB — BASIC METABOLIC PANEL
ANION GAP: 8 (ref 5–15)
BUN: 20 mg/dL (ref 6–20)
CHLORIDE: 103 mmol/L (ref 101–111)
CO2: 27 mmol/L (ref 22–32)
Calcium: 9.1 mg/dL (ref 8.9–10.3)
Creatinine, Ser: 0.55 mg/dL (ref 0.44–1.00)
GFR calc non Af Amer: >60 mL/min (ref >60)
Glucose, Bld: 104 mg/dL — ABNORMAL HIGH (ref 65–99)
POTASSIUM: 4.1 mmol/L (ref 3.5–5.1)
Sodium: 138 mmol/L (ref 135–145)

## 2015-01-12 ENCOUNTER — Encounter: Payer: Self-pay | Admitting: Internal Medicine

## 2015-01-13 NOTE — Progress Notes (Signed)
Patient ID: Tara Welch, female   DOB: [birth year], 81 y.o.   MRN: [id number]       This is an acute visit.  Level care skilled.  Facility [facility].  Chief complaint acute visit follow-up weight loss--UTI  History of present illness.  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  She continues with marginal by mouth intake swallowing study did show some penetration--we will weight had stabilized however now appears to be slowly declining againmost recently 108.4t appears  previously as been as high appears as around 116  she is currently on honey thick liquids coughed apparently on nectar thick liquids she is also on a purred-diet-she does not complain of any difficulty swallowing or abdominal discomfort.   There has been a transition. In deciding on a responsible  party for patient it is nowt her niece-she has discussed the option of possibly starting patienton Megace she has been on Remeron.  We seriously consider this however per pharmacy recommendation they thought the risks inherent with using Megace on this frail elderly female would outweigh the benefits--and I tend to agree with this with the risk of DVT among other concerns.  At this point fluids and by mouth intake will have to be encouraged aggressively will continue the Remeron for now  [facility] she appears to be stable and her labs have actually been stable her albumin was 3.3 on August 2-metabolic panel remains unremarkable.  Her responsible party does not want aggressive measures i.e. a feeding tube per discussion with her today.        She does have a history of UTIs and has just completed amoxicillin for a UTI-culture had grown out Proteus and Escherichia coli both were sensitive to the amoxicillin area  She has been afebrile vital signs remained stable  her urine apparently has cleared up    Family medical social history has been reviewed per admission note on 10/18/2014.  Medications have been reviewed they are fairly minimal actually including aspirin 325 mg a day-atorvastatin 10 mg day-potassium 10 mEq a day-Remeron 7.5 mg daily at bedtime-and Synthroid 50 g daily-she is on Magic cup supplementation 3 times a day also on Tylenol 650 mg when necessary.  .  Review of systems again this is limited secondary to patient's relatively aphasic status obtained from patient and nursing.  In general no complaints of fever or chills.  Head ears eyes nose mouth--does not complain of sore throat.  Respiratory no complaints of cough or shortness of breath currently.  Cardiac no complaint of chest pain.  GI does not complain of abdominal discomfort nausea or vomiting no diarrhea apparently occasionally has constipation per nursing.  GU dysuria symptoms have improved  Neuro--c again history of significant CVA with left-sided neglect does not complain of headache or dizziness.  Physical exam.   Temperature 97.5 pulse 85 respirations 20 blood pressure 126/63 weight is 108.4 back on July 11 was 113  In general this is a frail elderly female in no distress she is pleasant sitting in the wheelchair.  Her skin is warm and dry skin turgor does not appear to be grossly impaired.  Oropharynx is clear mucous membranes appear fairly moist.  Chest is clear to auscultation with somewhat shallow air entry.  Heart is regular rate and rhythm without murmur gallop or rub she does not have significant lower extremity edema.  Abdomen is soft nontender positive bowel sounds  GU--could not really appreciate suprapubic tenderness today-- she has amber colored urine in the Foley.  Muscle skeletal she continues with left-sided hemiparalysis  . Neurologic as noted above  Psych she is grossly conversant although there are periods of  confusion.  Labs  12/20/2014.  Sodium 138 potassium 3.8 BUN 13 creatinine 0.52  12/14/2014.  Sodium 136 potassium 3.9 BUN 19 creatinine 0.57.  Liver function tests within normal limits except albumin noted to be 3.3.  WBC 4.9 hemoglobin 10.2 platelets 249.  12/03/2014.  WBC 5.9 hemoglobin 10.5 platelets 275.  Sodium 136 potassium 4.1 BUN 21 creatinine 0.54.  Albumin 3.5 liver function tests within normal limits  11/25/2014.  Sodium 138 potassium 3.9 BUN 19 creatinine 0.54.  WBC 4.7 hemoglobin 10.4 platelets 241-albumen 3.6.  Assessment and plan.  #1-history of continued poor marginal oral intake- Continues to be a challenging situation she is on dietary supplementation and will continue on Remeron per discussion as noted above-this was discussed with her responsible party she does not want aggressive measures i.e. a PEG tube Her labs actually appear fairly unremarkable-will recheck a metabolic panel next week   #2 history UTI   Has completed antibiotic therapy this appears to be relatively asymptomatic at this point will monitor   CVA related to dysphagia-again she is on a modified diet with honey thick liquids as well as pureed--  by mouth intake again will have to be encouraged strongly she is on aspirin for prophylaxis.  [id number].--Of note greater than 30 minutes spent assessing patient-discussing her status with nursing staff as well as with her responsible party-and coordinating plan of care with input of responsible party.  Of note greater than 50% of time spent coordinating plan of care   .    Marland Kitchen

## 2015-01-28 ENCOUNTER — Encounter (HOSPITAL_COMMUNITY)
Admission: RE | Admit: 2015-01-28 | Discharge: 2015-01-28 | Disposition: A | Discharge status: Home / Self Care | Payer: Medicare Other | Source: Skilled Nursing Facility | Attending: Internal Medicine | Admitting: Internal Medicine

## 2015-01-28 DIAGNOSIS — R3989 Other symptoms and signs involving the genitourinary system: Secondary | ICD-10-CM | POA: Insufficient documentation

## 2015-01-28 DIAGNOSIS — I639 Cerebral infarction, unspecified: Secondary | ICD-10-CM | POA: Diagnosis present

## 2015-01-28 DIAGNOSIS — N390 Urinary tract infection, site not specified: Secondary | ICD-10-CM | POA: Diagnosis present

## 2015-01-28 LAB — BASIC METABOLIC PANEL
ANION GAP: 8 (ref 5–15)
BUN: 13 mg/dL (ref 6–20)
CO2: 25 mmol/L (ref 22–32)
Calcium: 9.2 mg/dL (ref 8.9–10.3)
Chloride: 108 mmol/L (ref 101–111)
Creatinine, Ser: 0.51 mg/dL (ref 0.44–1.00)
GFR calc Af Amer: >60 mL/min (ref >60)
GFR calc non Af Amer: >60 mL/min (ref >60)
Glucose, Bld: 89 mg/dL (ref 65–99)
Potassium: 4.1 mmol/L (ref 3.5–5.1)
Sodium: 141 mmol/L (ref 135–145)

## 2015-01-28 LAB — CBC WITH DIFFERENTIAL/PLATELET
Basophils Absolute: 0 10*3/uL (ref 0.0–0.1)
Basophils Relative: 0 %
Eosinophils Absolute: 0.1 10*3/uL (ref 0.0–0.7)
Eosinophils Relative: 2 %
HEMATOCRIT: 38.8 % (ref 36.0–46.0)
Hemoglobin: 12.6 g/dL (ref 12.0–15.0)
LYMPHS ABS: 1.2 10*3/uL (ref 0.7–4.0)
Lymphocytes Relative: 27 %
MCH: 31.2 pg (ref 26.0–34.0)
MCHC: 32.5 g/dL (ref 30.0–36.0)
MCV: 96 fL (ref 78.0–100.0)
MONOS PCT: 8 %
Monocytes Absolute: 0.4 10*3/uL (ref 0.1–1.0)
NEUTROS ABS: 2.7 10*3/uL (ref 1.7–7.7)
Neutrophils Relative %: 61 %
Platelets: 205 10*3/uL (ref 150–400)
RBC: 4.04 MIL/uL (ref 3.87–5.11)
RDW: 13.9 % (ref 11.5–15.5)
WBC: 4.4 10*3/uL (ref 4.0–10.5)

## 2015-01-28 LAB — TSH: TSH: 2.697 u[IU]/mL (ref 0.350–4.500)

## 2015-02-23 ENCOUNTER — Non-Acute Institutional Stay (SKILLED_NURSING_FACILITY): Payer: Medicare Other | Admitting: Internal Medicine

## 2015-02-23 ENCOUNTER — Encounter: Payer: Self-pay | Admitting: Internal Medicine

## 2015-02-23 DIAGNOSIS — I639 Cerebral infarction, unspecified: Secondary | ICD-10-CM

## 2015-02-23 DIAGNOSIS — R630 Anorexia: Secondary | ICD-10-CM

## 2015-02-23 DIAGNOSIS — E038 Other specified hypothyroidism: Secondary | ICD-10-CM | POA: Diagnosis not present

## 2015-02-23 DIAGNOSIS — G8194 Hemiplegia, unspecified affecting left nondominant side: Secondary | ICD-10-CM | POA: Diagnosis not present

## 2015-02-23 DIAGNOSIS — E039 Hypothyroidism, unspecified: Secondary | ICD-10-CM | POA: Insufficient documentation

## 2015-02-23 NOTE — Progress Notes (Signed)
Patient ID: Tara Welch, female   DOB: [birth year], 81 y.o.   MRN: [id number]        This is a routine  visit.  Level care skilled.  Facility [facility].    History of present illness.  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  Her weight currently is 104.2 appears to months ago it was 108 but stabilized shortly thereafter her admission weight appears to be 116.  Her diet has been upgraded to mechanical soft with [place] meat as well as honey thick liquids-she apparently is tolerating this well and is helping to improve her by mouth intake-she is a [employer] and apparently doing quite well with this.  Her response to party her niece is here today to fill out .a MOST--form-she desires no CPR- limited IVs for a defined period of time and limited use of antibiotics for defined period of time if deemed medically necessary as well no intubation-- hospitalization if deemed necessary but would like to minimize hospitalizations.      Medications have been reviewed they are fairly minimal actually including aspirin 325 mg a day-atorvastatin 10 mg day-potassium 10 mEq a day-Remeron 7.5 mg daily at bedtime-and Synthroid 50 g daily-she is on Magic cup supplementation 3 times a day also on Tylenol 650 mg when necessary.  .  Review of systems again this is limited secondary to patient's relatively aphasic status obtained from patient and nursing.  In general no complaints of fever or chills.  Head ears eyes nose mouth--does not complain of sore throat.  Respiratory no complaints of cough or shortness of breath currently.  Cardiac no complaint of chest  pain.  GI does not complain of abdominal discomfort nausea or vomiting no diarrhea or recent constipation  GU dysuria symptoms have improved  Neuro--c again history of significant CVA with left-sided neglect does not complain of headache or dizziness--appears to have had some mild improvement.  Physical exam.    Temperature 97.2 pulse 90 respirations 20 blood pressure  126/56 this appears to be stable weight appears to have stabilized recently at 104.2   In general this is a frail elderly female in no distress she is pleasant sitting in the wheelchair.  Her skin is warm and dry skin turgor does not appear to be grossly impaired.  Oropharynx is clear mucous membranes appear fairly moist.  Chest is clear to auscultation with somewhat shallow air entry.  Heart is regular rate and rhythm without murmur gallop or rub she does not have significant lower extremity edema.  Abdomen is soft nontender positive bowel sounds  GU--could not really appreciate suprapubic tenderness today-- she has amber colored urine in the Foley.  Muscle skeletal she continues with left-sided hemiparalysis--strength appears to be intact on the right however she continues with some dysarthric speech   . Neurologic as noted above__she actually speaks quite a bit appears to be pleasant follow simple verbal commands   Psych she is grossly conversant although there are periods of confusion.  Labs  01/28/2015.  TSH-2.697.  Sodium 141 potassium 4.1 BUN 13 creatinine 0.51.  WBC 4.4 hemoglobin 12.6 platelets 205.    12/20/2014.  Sodium 138 potassium 3.8 BUN 13 creatinine 0.52  12/14/2014.  Sodium 136 potassium 3.9 BUN 19 creatinine 0.57.  Liver function tests within normal limits except albumin noted to be 3.3.  WBC 4.9 hemoglobin 10.2 platelets 249.  12/03/2014.  WBC 5.9 hemoglobin 10.5 platelets 275.  Sodium 136 potassium 4.1 BUN 21 creatinine 0.54.  Albumin 3.5 liver function tests within  normal limits  11/25/2014.  Sodium 138 potassium 3.9 BUN 19 creatinine 0.54.  WBC 4.7 hemoglobin 10.4 platelets 241-albumen 3.6.  Assessment and plan.  #1 history of extensive right middle cerebral artery stroke-continues with left hemiparalysis and left homonymous hemianopsia---she actually has made a small amount of functional recovery she is bright alert although confused at times apparently is eating well with supportive care which is encouraging her weight has stabilized-again a most form has been filled out as noted above with limited interventions.  Will update lab work including a CMP she is taking supplements well according to nursing staff as well.  #2 history hypothyroidism she is on Synthroid TSH was within normal limits at 2.697 in September at this point monitor.  #3 urinary retention she continues with an indwelling Foley catheter she has failed previous trials.  #4 history of depressed appetite apparently this has improved she is on Remeron-at one time Megace was considered resident there was concern for side effects risk for DVT and thus it was discontinued.  #5 history of hypokalemia she is on low-dose potassium again will update a metabolic panel this appears to be stable per most recent lab  [id number].          Marland Kitchen    Marland Kitchen

## 2015-02-25 ENCOUNTER — Encounter (HOSPITAL_COMMUNITY)
Admission: AD | Admit: 2015-02-25 | Discharge: 2015-02-25 | Disposition: A | Discharge status: Home / Self Care | Payer: Medicare Other | Source: Skilled Nursing Facility | Attending: Internal Medicine | Admitting: Internal Medicine

## 2015-02-25 DIAGNOSIS — R3989 Other symptoms and signs involving the genitourinary system: Secondary | ICD-10-CM | POA: Insufficient documentation

## 2015-02-25 DIAGNOSIS — I639 Cerebral infarction, unspecified: Secondary | ICD-10-CM | POA: Diagnosis present

## 2015-02-25 DIAGNOSIS — N390 Urinary tract infection, site not specified: Secondary | ICD-10-CM | POA: Diagnosis present

## 2015-02-25 LAB — COMPREHENSIVE METABOLIC PANEL
ALT: 28 U/L (ref 14–54)
AST: 31 U/L (ref 15–41)
Albumin: 3.5 g/dL (ref 3.5–5.0)
Alkaline Phosphatase: 85 U/L (ref 38–126)
Anion gap: 9 (ref 5–15)
BUN: 16 mg/dL (ref 6–20)
CHLORIDE: 105 mmol/L (ref 101–111)
CO2: 26 mmol/L (ref 22–32)
Calcium: 9.3 mg/dL (ref 8.9–10.3)
Creatinine, Ser: 0.5 mg/dL (ref 0.44–1.00)
Glucose, Bld: 91 mg/dL (ref 65–99)
Potassium: 3.8 mmol/L (ref 3.5–5.1)
Sodium: 140 mmol/L (ref 135–145)
Total Bilirubin: 0.5 mg/dL (ref 0.3–1.2)
Total Protein: 6.6 g/dL (ref 6.5–8.1)

## 2015-02-25 LAB — CBC WITH DIFFERENTIAL/PLATELET
Basophils Absolute: 0 10*3/uL (ref 0.0–0.1)
Basophils Relative: 0 %
EOS ABS: 0.1 10*3/uL (ref 0.0–0.7)
Eosinophils Relative: 3 %
HCT: 36.6 % (ref 36.0–46.0)
HEMOGLOBIN: 11.8 g/dL — AB (ref 12.0–15.0)
LYMPHS ABS: 1.3 10*3/uL (ref 0.7–4.0)
Lymphocytes Relative: 27 %
MCH: 31.2 pg (ref 26.0–34.0)
MCHC: 32.2 g/dL (ref 30.0–36.0)
MCV: 96.8 fL (ref 78.0–100.0)
Monocytes Absolute: 0.5 10*3/uL (ref 0.1–1.0)
Monocytes Relative: 10 %
NEUTROS PCT: 60 %
Neutro Abs: 2.8 10*3/uL (ref 1.7–7.7)
Platelets: 251 10*3/uL (ref 150–400)
RBC: 3.78 MIL/uL — AB (ref 3.87–5.11)
RDW: 14.7 % (ref 11.5–15.5)
WBC: 4.7 10*3/uL (ref 4.0–10.5)

## 2015-03-09 ENCOUNTER — Ambulatory Visit (HOSPITAL_COMMUNITY)
Admission: RE | Admit: 2015-03-09 | Discharge: 2015-03-09 | Disposition: A | Discharge status: Home / Self Care | Payer: Medicare Other | Source: Ambulatory Visit | Attending: Internal Medicine | Admitting: Internal Medicine

## 2015-03-09 ENCOUNTER — Non-Acute Institutional Stay (SKILLED_NURSING_FACILITY): Payer: Medicare Other | Admitting: Internal Medicine

## 2015-03-09 DIAGNOSIS — R05 Cough: Secondary | ICD-10-CM | POA: Diagnosis not present

## 2015-03-09 DIAGNOSIS — J984 Other disorders of lung: Secondary | ICD-10-CM | POA: Diagnosis not present

## 2015-03-09 DIAGNOSIS — J690 Pneumonitis due to inhalation of food and vomit: Secondary | ICD-10-CM

## 2015-03-09 DIAGNOSIS — R0602 Shortness of breath: Secondary | ICD-10-CM | POA: Insufficient documentation

## 2015-03-09 DIAGNOSIS — I69391 Dysphagia following cerebral infarction: Secondary | ICD-10-CM | POA: Diagnosis not present

## 2015-03-10 ENCOUNTER — Other Ambulatory Visit (HOSPITAL_COMMUNITY)
Admission: AD | Admit: 2015-03-10 | Discharge: 2015-03-10 | Disposition: A | Discharge status: Home / Self Care | Payer: Medicare Other | Source: Skilled Nursing Facility | Attending: Internal Medicine | Admitting: Internal Medicine

## 2015-03-10 DIAGNOSIS — N390 Urinary tract infection, site not specified: Secondary | ICD-10-CM | POA: Diagnosis present

## 2015-03-10 LAB — URINALYSIS, ROUTINE W REFLEX MICROSCOPIC
Bilirubin Urine: NEGATIVE
GLUCOSE, UA: NEGATIVE mg/dL
Ketones, ur: NEGATIVE mg/dL
Nitrite: NEGATIVE
PH: 6 (ref 5.0–8.0)
Protein, ur: 100 mg/dL — AB
SPECIFIC GRAVITY, URINE: 1.02 (ref 1.005–1.030)
Urobilinogen, UA: 0.2 mg/dL (ref 0.0–1.0)

## 2015-03-10 LAB — URINE MICROSCOPIC-ADD ON

## 2015-03-13 ENCOUNTER — Encounter (HOSPITAL_COMMUNITY)
Admission: RE | Admit: 2015-03-13 | Discharge: 2015-03-13 | Disposition: A | Discharge status: Home / Self Care | Payer: Medicare Other | Source: Skilled Nursing Facility | Attending: Internal Medicine | Admitting: Internal Medicine

## 2015-03-13 DIAGNOSIS — R3989 Other symptoms and signs involving the genitourinary system: Secondary | ICD-10-CM | POA: Diagnosis not present

## 2015-03-13 LAB — URINALYSIS, ROUTINE W REFLEX MICROSCOPIC
Bilirubin Urine: NEGATIVE
Glucose, UA: NEGATIVE mg/dL
Nitrite: POSITIVE — AB
PROTEIN: 100 mg/dL — AB
Specific Gravity, Urine: <1.005 — ABNORMAL LOW (ref 1.005–1.030)
UROBILINOGEN UA: 2 mg/dL — AB (ref 0.0–1.0)
pH: >9 — ABNORMAL HIGH (ref 5.0–8.0)

## 2015-03-13 LAB — URINE CULTURE

## 2015-03-13 LAB — URINE MICROSCOPIC-ADD ON

## 2015-03-14 ENCOUNTER — Encounter (HOSPITAL_COMMUNITY)
Admission: AD | Admit: 2015-03-14 | Discharge: 2015-03-14 | Disposition: A | Discharge status: Home / Self Care | Payer: Medicare Other | Source: Skilled Nursing Facility | Attending: Internal Medicine | Admitting: Internal Medicine

## 2015-03-14 DIAGNOSIS — R3989 Other symptoms and signs involving the genitourinary system: Secondary | ICD-10-CM | POA: Diagnosis not present

## 2015-03-14 LAB — URINE CULTURE

## 2015-03-15 ENCOUNTER — Non-Acute Institutional Stay (SKILLED_NURSING_FACILITY): Payer: Medicare Other | Admitting: Internal Medicine

## 2015-03-15 DIAGNOSIS — I639 Cerebral infarction, unspecified: Secondary | ICD-10-CM

## 2015-03-15 DIAGNOSIS — N10 Acute tubulo-interstitial nephritis: Secondary | ICD-10-CM

## 2015-03-15 DIAGNOSIS — R630 Anorexia: Secondary | ICD-10-CM | POA: Diagnosis not present

## 2015-03-15 LAB — URINE CULTURE

## 2015-03-15 NOTE — Progress Notes (Signed)
Patient ID: Tara Welch, female   DOB: 1921/08/07, 81 y.o.   MRN: [id number]         This is an acute  visit.  Level care skilled.  Facility [facility].   Chief complaint-acute visit secondary to decreased by mouth intake-suspicious for UTI   History of present illness.  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  Her diet had been upgraded to mechanical soft with Pureed meat as well as honey thick liquids -She does go to restorative dining-however nursing staff has noted that her appetite has recently declined-Nursing staff  this happens when she has a UTI which she is prone to.  A urinalysis is come back showing suspicions for UTI with white blood cells too numerous to count large leukocytes and positive nitrites.  I recently spoke with her new niece who completed.a MOST--form-she desires no CPR- limited IVs for a defined period of time and limited use of antibiotics for defined period of time if deemed medically necessary as well no intubation-- hospitalization if deemed necessary but would like to minimize hospitalizations.      Medications have been reviewed they are fairly minimal actually including aspirin 325 mg a day-atorvastatin 10 mg day-potassium 10 mEq a day-Remeron 7.5 mg daily at bedtime-and Synthroid 50 g daily-she is on Magic cup supplementation 3 times a day also on Tylenol 650 mg when necessary.  .  Review of systems again this is limited secondary to patient's relatively aphasic status obtained from patient and nursing.  In general no complaints of fever or chills.  Head  ears eyes nose mouth--does not complain of sore throat.  Respiratory no complaints of cough or shortness of breath currently.  Cardiac no complaint of chest pain.  GI does not complain of abdominal discomfort nausea or vomiting no diarrhea or recent constipation  GU nursing staff feels she may have a UTI with poor by mouth intake she does not overtly complain of dysuria but is a poor historian   Neuro--c again history of significant CVA with left-sided neglect does not complain of headache or dizziness--appears to have had some mild improvement.  Physical exam.  Temperature 98.5 pulse 74 respirations 18 blood pressure 123/67 weight is 101.6 this appears to be down about 3 pounds over the past several weeks  In general this is a frail elderly female in no distress she is pleasant sitting in the wheelchair.  Her skin is warm and dry skin turgor does not appear to be slightly dry  Oropharynx is clear mucous membranes appear fairly moist.  Chest is clear to auscultation with somewhat shallow air entry.  Heart is regular rate and rhythm without murmur gallop or rub she does not have significant lower extremity edema.  Abdomen is soft nontender positive bowel sounds  GU--some possible mild suprapubic tenderness to palpation there is some sediment in her urine  Muscle skeletal she continues with left-sided hemiparalysis--strength appears to be intact on the right however she continues with some dysarthric speech   . Neurologic as noted above__she actually speaks quite a bit appears to be pleasant follow simple verbal commands   Psych she is  grossly conversant although there are periods of confusion.  Labs  02/25/2015.  Sodium 140 potassium 3.8 BUN 16 creatinine 0.5.  Albumin 3.5-liver function tests within normal limits.  WBC 4.7 hemoglobin 11.8 platelets 251.    01/28/2015.  TSH-2.697.  Sodium 141 potassium 4.1 BUN 13 creatinine 0.51.  WBC 4.4 hemoglobin 12.6 platelets  205.    12/20/2014.  Sodium 138 potassium 3.8 BUN 13 creatinine 0.52  12/14/2014.  Sodium 136 potassium 3.9 BUN 19 creatinine 0.57.  Liver function tests within normal limits except albumin noted to be 3.3.  WBC 4.9 hemoglobin 10.2 platelets 249.  12/03/2014.  WBC 5.9 hemoglobin 10.5 platelets 275.  Sodium 136 potassium 4.1 BUN 21 creatinine 0.54.  Albumin 3.5 liver function tests within normal limits  11/25/2014.  Sodium 138 potassium 3.9 BUN 19 creatinine 0.54.  WBC 4.7 hemoglobin 10.4 platelets 241-albumen 3.6.  Assessment and plan.  #1 history of extensive right middle cerebral artery stroke-continues with left hemiparalysis and left homonymous hemianopsia---she actually has made a small amount of functional recovery she is bright alert although confused at times  Her appetite recently has declined-this occurs at times when she has a UTI-clinically she does not appear to be unstable but appears she might be getting a little dry.  I did discuss situation with her responsible party her niece-she is okay with doing IVs short-term low-dose dart and IV normal saline at 60 mL an hour for 2 L and monitor  She is on Remeron for appetite stimulation this appears to have helped in the past.  There is strong suspicion for UTI here with some suprapubic tenderness in concerning urinalysis-will start Cipro empirically 2050 mg twice a day for 7 days and wait for culture results this was discussed with her responsible party as well.     [id number].          Marland Kitchen    Marland Kitchen

## 2015-03-16 ENCOUNTER — Encounter (HOSPITAL_COMMUNITY)
Admission: AD | Admit: 2015-03-16 | Discharge: 2015-03-16 | Disposition: A | Discharge status: Home / Self Care | Payer: Medicare Other | Source: Skilled Nursing Facility | Attending: Internal Medicine | Admitting: Internal Medicine

## 2015-03-16 DIAGNOSIS — E079 Disorder of thyroid, unspecified: Secondary | ICD-10-CM | POA: Diagnosis present

## 2015-03-16 DIAGNOSIS — I69898 Other sequelae of other cerebrovascular disease: Secondary | ICD-10-CM | POA: Insufficient documentation

## 2015-03-16 LAB — BASIC METABOLIC PANEL
ANION GAP: 6 (ref 5–15)
BUN: 23 mg/dL — AB (ref 6–20)
CHLORIDE: 103 mmol/L (ref 101–111)
CO2: 29 mmol/L (ref 22–32)
Calcium: 9.2 mg/dL (ref 8.9–10.3)
Creatinine, Ser: 0.64 mg/dL (ref 0.44–1.00)
GFR calc Af Amer: >60 mL/min (ref >60)
GFR calc non Af Amer: >60 mL/min (ref >60)
GLUCOSE: 107 mg/dL — AB (ref 65–99)
Potassium: 4 mmol/L (ref 3.5–5.1)
Sodium: 138 mmol/L (ref 135–145)

## 2015-03-16 LAB — CBC WITH DIFFERENTIAL/PLATELET
BASOS ABS: 0 10*3/uL (ref 0.0–0.1)
Basophils Relative: 0 %
EOS PCT: 2 %
Eosinophils Absolute: 0.2 10*3/uL (ref 0.0–0.7)
HCT: 35.3 % — ABNORMAL LOW (ref 36.0–46.0)
Hemoglobin: 11.2 g/dL — ABNORMAL LOW (ref 12.0–15.0)
LYMPHS ABS: 1 10*3/uL (ref 0.7–4.0)
Lymphocytes Relative: 12 %
MCH: 30.4 pg (ref 26.0–34.0)
MCHC: 31.7 g/dL (ref 30.0–36.0)
MCV: 95.7 fL (ref 78.0–100.0)
Monocytes Absolute: 0.5 10*3/uL (ref 0.1–1.0)
Monocytes Relative: 6 %
NEUTROS ABS: 6.5 10*3/uL (ref 1.7–7.7)
Neutrophils Relative %: 80 %
PLATELETS: 278 10*3/uL (ref 150–400)
RBC: 3.69 MIL/uL — AB (ref 3.87–5.11)
RDW: 14.3 % (ref 11.5–15.5)
WBC: 8.1 10*3/uL (ref 4.0–10.5)

## 2015-03-16 NOTE — Progress Notes (Addendum)
Patient ID: Tara Welch, female   DOB: [birth year], 81 y.o.   MRN: [id number]                PROGRESS NOTE  DATE:  03/09/2015         FACILITY: [facility]                   LEVEL OF CARE:   SNF   Acute Visit                 CHIEF COMPLAINT:  Shortness of breath.    HISTORY OF PRESENT ILLNESS:  I was asked to see this patient today by the patient's LPN.  She noted that her lungs sounded bad early this morning and felt she needed a chest x-ray.    Also, she has not put out any urine today.     This is a patient who initially came to [place] in June after suffering an extensive right middle artery stroke.  She has dense hemiparesis and left homonymous hemianopsia.  At the time, I did not know whether she was really going to stabilize here and I wondered whether this could be a hospice type situation, although she has managed to stabilize for quite a few months now.    I hear episodically from the speech therapist that the patient eats maybe 25-50%.  The family was not interested in consideration of parenteral feeding.    Past Medical History  Diagnosis Date  . Cancer [facility])     right breast  . Thyroid disease     REVIEW OF SYSTEMS:   Not really possible from the patient.      PHYSICAL EXAMINATION:   VITAL SIGNS:     TEMPERATURE:  98.2.    PULSE:  86.    RESPIRATIONS:  14.    BLOOD PRESSURE:  107/81.     02 SATURATIONS:  95% on room air, and this was verified by myself today.   WEIGHT:  101.6 pounds, which seems down four pounds over the last month.   CHEST/RESPIRATORY:  Shallow air entry at the bases.   There is no wheezing.  There are no crackles.   CARDIOVASCULAR:   CARDIAC:  Heart sounds are normal.  She has a grade 3/6 pansystolic murmur at the lower left sternal border.  She looks somewhat volume contracted, but not too bad.      GASTROINTESTINAL:   LIVER/SPLEEN/KIDNEYS:  No liver, no spleen.  No tenderness.    GENITOURINARY:   BLADDER:  Her bladder is not  distended.  However, there is no urine output in the Foley catheter.    ASSESSMENT/PLAN:              Probable aspiration pneumonitis.   Right now, her lungs sound clear and she is not febrile.  I will go ahead and order the portable AP chest x-ray.      Urinary retention with a Foley catheter.  My original admission notes in June make reference to the fact that the patient could be considered for a voiding trial, although I do not actually have a memory of this being done.  It is possible that the patient's Foley catheter could be removed.                 ADDENDUM:  Lab work earlier this month showed a comprehensive metabolic panel that was completely normal, including BUN and creatinine and a serum albumin of 3.5.  White count was 4.7, hemoglobin of 11.8.

## 2015-03-22 ENCOUNTER — Encounter: Payer: Self-pay | Admitting: Internal Medicine

## 2015-05-03 ENCOUNTER — Encounter: Payer: Self-pay | Admitting: Internal Medicine

## 2015-05-03 ENCOUNTER — Non-Acute Institutional Stay (SKILLED_NURSING_FACILITY): Payer: Medicare Other | Admitting: Internal Medicine

## 2015-05-03 ENCOUNTER — Other Ambulatory Visit (HOSPITAL_COMMUNITY)
Admission: RE | Admit: 2015-05-03 | Discharge: 2015-05-03 | Disposition: A | Discharge status: Home / Self Care | Payer: Medicare Other | Source: Skilled Nursing Facility | Attending: Internal Medicine | Admitting: Internal Medicine

## 2015-05-03 DIAGNOSIS — R569 Unspecified convulsions: Secondary | ICD-10-CM | POA: Insufficient documentation

## 2015-05-03 DIAGNOSIS — G8194 Hemiplegia, unspecified affecting left nondominant side: Secondary | ICD-10-CM

## 2015-05-03 DIAGNOSIS — I10 Essential (primary) hypertension: Secondary | ICD-10-CM | POA: Diagnosis present

## 2015-05-03 DIAGNOSIS — D500 Iron deficiency anemia secondary to blood loss (chronic): Secondary | ICD-10-CM | POA: Diagnosis present

## 2015-05-03 LAB — COMPREHENSIVE METABOLIC PANEL
ALT: 15 U/L (ref 14–54)
AST: 25 U/L (ref 15–41)
Albumin: 3.6 g/dL (ref 3.5–5.0)
Alkaline Phosphatase: 71 U/L (ref 38–126)
Anion gap: 10 (ref 5–15)
BUN: 29 mg/dL — AB (ref 6–20)
CHLORIDE: 104 mmol/L (ref 101–111)
CO2: 24 mmol/L (ref 22–32)
Calcium: 8.9 mg/dL (ref 8.9–10.3)
Creatinine, Ser: 0.63 mg/dL (ref 0.44–1.00)
Glucose, Bld: 107 mg/dL — ABNORMAL HIGH (ref 65–99)
POTASSIUM: 3.8 mmol/L (ref 3.5–5.1)
SODIUM: 138 mmol/L (ref 135–145)
Total Bilirubin: 0.6 mg/dL (ref 0.3–1.2)
Total Protein: 6.6 g/dL (ref 6.5–8.1)

## 2015-05-03 LAB — CBC WITH DIFFERENTIAL/PLATELET
Basophils Absolute: 0 10*3/uL (ref 0.0–0.1)
Basophils Relative: 0 %
EOS PCT: 2 %
Eosinophils Absolute: 0.1 10*3/uL (ref 0.0–0.7)
HCT: 34.7 % — ABNORMAL LOW (ref 36.0–46.0)
HEMOGLOBIN: 11.5 g/dL — AB (ref 12.0–15.0)
LYMPHS ABS: 1.5 10*3/uL (ref 0.7–4.0)
LYMPHS PCT: 20 %
MCH: 32.3 pg (ref 26.0–34.0)
MCHC: 33.1 g/dL (ref 30.0–36.0)
MCV: 97.5 fL (ref 78.0–100.0)
MONOS PCT: 9 %
Monocytes Absolute: 0.7 10*3/uL (ref 0.1–1.0)
NEUTROS PCT: 69 %
Neutro Abs: 5.4 10*3/uL (ref 1.7–7.7)
Platelets: 223 10*3/uL (ref 150–400)
RBC: 3.56 MIL/uL — ABNORMAL LOW (ref 3.87–5.11)
RDW: 14.6 % (ref 11.5–15.5)
WBC: 7.8 10*3/uL (ref 4.0–10.5)

## 2015-05-03 LAB — TSH: TSH: 6.046 u[IU]/mL — AB (ref 0.350–4.500)

## 2015-05-03 NOTE — Progress Notes (Signed)
Patient ID: Tara Welch, female   DOB: 12-03-1921, 81 y.o.   MRN: [id number]                      This is an acute  visit.  Level care skilled.  Facility [facility].  Chief complaint-acute visit secondary to seizure activity--new onset  History of present illness.  Patient is a pleasant elderly female who had a large nondominant hemispheric CVA involving the temporal frontal and parietal lobes-she has left hemiparalysis and left neglect this has improved somewhat  Initially she has significant weight loss but this appears to have stabilized according nursing staff she is eating and drinking somewhat better which is encouraging--her weight has stabilized at around 100 pounds-initial admission weight was 116 and there was a fairly significant decline but again the last month or 2 this appears to have stabilized somewhat.  Most acute issue today is what appeared to be a grand mal seizure per nursing report-apparently this lasted approximately 2 minutes by the time I arrived in the room she was in the post state and was sleeping.  This apparently is new onset there is been no reported history of seizures previously per chart review.  Patient does have a MOST form-with limited interventions.  Her vital signs were stable post seizure she was not afebrile her blood sugar was 1:15 O2 saturation was in the 90s on room air.  It appears she did have a small cut to her tongue.  I did do serial exams status post seizure and she slowly returned back to her baseline in fact when I saw her later this afternoon she was alert and talking continues with left-sided hemiparalysis which is not new but essentially is at her baseline-she does not recall any thing about the seizure-she is a somewhat poor historian  However  His medical history.  -History of right breast cancer.  Hypothyroidism.  History CVA right middle cerebral artery-territory.  Failure to thrive.  Past surgical  history.  Right breast reconstruction.  Eyes surgery.    Medications have been reviewed they are fairly minimal actually including aspirin 325 mg a day-atorvastatin 10 mg day-potassium 10 mEq a day-Remeron 7.5 mg daily at bedtime-and Synthroid 50 g daily-she is on Magic cup supplementation 3 times a day also on Tylenol 650 mg when necessary  .  Marland Kitchen  Review of systems  Initially unattainable-once patient became responsive she was not complaining of any pain or shortness of breath she is a poor historian.  Per nursing she had been at her baseline before seizure activity with no complaints     Physical exam.   She is afebrile pulse of 72 respirations 19 blood pressure 110/45 O2 saturation in the low 90s on room air-CBG 1:15.    In general this is a frail elderly female initially sleeping  not really responding to verbal commands.  Her skin is warm and dry  Pupils appear to be reactive to light.  Oropharyn patient does have a small amount of blood in oropharynx this appears to be coming from a small cut on her tongue-and this was cleared by nursing  Chest is clear to auscultation- Heart is regular rate and rhythm with 2/6 systoloic murmur murmur she does not have significant lower extremity edema.  Abdomen is soft nontender positive bowel sounds  GU--could not really appreciate suprapubic tenderness today-- she has amber colored urine in the Foley.  Muscle skeletal she continues with left-sided hemiparalysis- She actually has  moved her right arm- and leg . Neurologic as noted above_ Other than somulence do not really note any difference from baseline-   Psych she is grossly conversant although there are periods of confusion.  Labs  03/16/2015.  Sodium 138 potassium 4 BUN 23 creatinine 0.64.  WBC 8.7 hemoglobin 11.2 platelets 278.  02/25/2015.  BUN 3.5-liver function tests within normal limits  01/28/2015.  TSH-2.697.  Sodium 141 potassium 4.1 BUN 13  creatinine 0.51.  WBC 4.4 hemoglobin 12.6 platelets 205.    12/20/2014.  Sodium 138 potassium 3.8 BUN 13 creatinine 0.52  12/14/2014.  Sodium 136 potassium 3.9 BUN 19 creatinine 0.57.  Liver function tests within normal limits except albumin noted to be 3.3.  WBC 4.9 hemoglobin 10.2 platelets 249.  12/03/2014.  WBC 5.9 hemoglobin 10.5 platelets 275.  Sodium 136 potassium 4.1 BUN 21 creatinine 0.54.  Albumin 3.5 liver function tests within normal limits  11/25/2014.  Sodium 138 potassium 3.9 BUN 19 creatinine 0.54.  WBC 4.7 hemoglobin 10.4 platelets 241-albumen 3.6.  Assessment and plan.  History of seizure disorder new onset-I did do serial exams and patient did return essentially at her baseline later in the afternoon she was speaking moving her right upper and lower extremities at baseline neurologically appeared to be back at her baseline again with left-sided hemiparalysis which is not new.  She was speaking-continue to do vital signs every 2 hours 2 and then every shift with neural checks.  Also ordered stat lab work which actually has come back unremarkable we are still waiting TSH but otherwise appears baseline with a sodium of 138 potassium 3.8 CO2 level XXIV-BUN 29 creatinine 0.63 liver function tests are unremarkable I note her albumin is 3.6.  White count is 7.8 hemoglobin 11.5 platelets 223  Also discussed her situation with Dr. Dellia Nims via phone and will start Pemberton empirically for seizures 500 mg twice a day.  I also discuss this with her responsible party her niece via phone.    #2 history of extensive right middle cerebral artery stroke-continues with left hemiparalysis and left homonymous hemianopsia---suspect at some point this has put her at risk for seizures at this point again will need close monitoring but she appears to be back at her baseline   [id number]--- of note greater than 50 minutes spent assessing patient-and reassessing patient  numerous times-as well as coordinating plan of care and reviewing labs.  Of note greater than 50% of time spent coordinating plan of care with lab and chart review as well as discussion with Dr. Dellia Nims and her responsible party.          Marland Kitchen    Marland Kitchen

## 2015-05-04 ENCOUNTER — Non-Acute Institutional Stay (SKILLED_NURSING_FACILITY): Payer: Medicare Other | Admitting: Internal Medicine

## 2015-05-04 DIAGNOSIS — R569 Unspecified convulsions: Secondary | ICD-10-CM | POA: Diagnosis not present

## 2015-05-04 DIAGNOSIS — I69959 Hemiplegia and hemiparesis following unspecified cerebrovascular disease affecting unspecified side: Secondary | ICD-10-CM | POA: Diagnosis not present

## 2015-05-04 NOTE — Progress Notes (Signed)
Patient ID: Tara Welch, female   DOB: Feb 17, 1922, 81 y.o.   MRN: [id number] Facility;[facility] Chief complaint generalized seizure yesterday History; the patient was observed to have ageneralized tonic-clonic seizure yesterday. She is a patient to had a a large nondominant hemisphere stroke earlier this year in early June. Initially we felt thatshe had sufficient failure to thrive that she may notsurvive this. However seems to havecently stabilized in terms of her oral intake and has not ha She went on yesterday to have a noticed generalized seizure. She had about 1 hour of postictal lethargy and then since has completely gotten back to her normal status.  Past Medical History  Diagnosis Date  . Cancer [facility])     right breast  . Thyroid disease    Past Surgical History  Procedure Laterality Date  . Breast reconstruction Right   . Eye surgery     CMP Latest Ref Rng 05/03/2015 03/16/2015 02/25/2015  Glucose 65 - 99 mg/dL 107(H) 107(H) 91  BUN 6 - 20 mg/dL 29(H) 23(H) 16  Creatinine 0.44 - 1.00 mg/dL 0.63 0.64 0.50  Sodium 135 - 145 mmol/L 138 138 140  Potassium 3.5 - 5.1 mmol/L 3.8 4.0 3.8  Chloride 101 - 111 mmol/L 104 103 105  CO2 22 - 32 mmol/L 24 29 26   Calcium 8.9 - 10.3 mg/dL 8.9 9.2 9.3  Total Protein 6.5 - 8.1 g/dL 6.6 - 6.6  Total Bilirubin 0.3 - 1.2 mg/dL 0.6 - 0.5  Alkaline Phos 38 - 126 U/L 71 - 85  AST 15 - 41 U/L 25 - 31  ALT 14 - 54 U/L 15 - 28   CBC Latest Ref Rng 05/03/2015 03/16/2015 02/25/2015  WBC 4.0 - 10.5 K/uL 7.8 8.1 4.7  Hemoglobin 12.0 - 15.0 g/dL 11.5(L) 11.2(L) 11.8(L)  Hematocrit 36.0 - 46.0 % 34.7(L) 35.3(L) 36.6  Platelets 150 - 400 K/uL 223 278 251   urrent medications Synthroid 50 g daily KCl 20 mEq daily Tylenol 650 every 4 when necessary Remeron 7.5 daily at bedtime Lipitor 10 daily  Review of systems is really not possible in this patient.  Physical examination Gen. The patient is right back to her normal status Respiratory shallow but  clear air entry bilaterally Cardiac heart sounds are normal no murmurs she appears to be euvolemic Abdomen; no liver no spleen no tenderness no masses Neurologicleft hemiparesis with left hemi-neglect None of this is new Mental status; I see no new issues here  Impression/plan #1 generalized seizure. This is the first such event. Given the underlying severity of her stroke I think recurrent seizures here likely therefore I think treatment is warranted. I'm going to start her on Keppraat 500 twice a day. I do not think that neuro imaging is warranted at  This point as she seems to a clinically gone back to the same states she was in before

## 2015-05-11 ENCOUNTER — Non-Acute Institutional Stay (SKILLED_NURSING_FACILITY): Payer: Medicare Other | Admitting: Internal Medicine

## 2015-05-11 DIAGNOSIS — R569 Unspecified convulsions: Secondary | ICD-10-CM | POA: Diagnosis not present

## 2015-05-11 DIAGNOSIS — T148 Other injury of unspecified body region: Secondary | ICD-10-CM

## 2015-05-11 DIAGNOSIS — E038 Other specified hypothyroidism: Secondary | ICD-10-CM | POA: Diagnosis not present

## 2015-05-11 DIAGNOSIS — I639 Cerebral infarction, unspecified: Secondary | ICD-10-CM | POA: Diagnosis not present

## 2015-05-11 DIAGNOSIS — T148XXA Other injury of unspecified body region, initial encounter: Secondary | ICD-10-CM

## 2015-05-11 NOTE — Progress Notes (Signed)
Patient ID: Tara Welch, female   DOB: [birth year], 81 y.o.   MRN: [id number]                       This is an acute  visit.  Level care skilled.  Facility [facility].  Chief complaint-acute visit follow-up seizure-contusion--hypothyroidism  History of present illness.  Patient is a pleasant elderly female who had a large nondominant hemispheric CVA involving the temporal frontal and parietal lobes-she has left hemiparalysis and left neglect this has improved somewhat  Initially she has significant weight loss but this appears to have stabilized according nursing staff she is eating and drinking somewhat better which is encouraging-- . Last week she appeared to have a grand mal seizure with tonic-clonic aspects-by the time I arrived in the room she was in her post seizure state was sleeping-eventually later that afternoon she returned essentially to her baseline with no further seizure activity noted.  At that point we did start her empirically on Keppra 500 mg twice a day per discussion with Dr. Dellia Nims.  Since then she has been seizure-free appears to be at her baseline.  Nursing staff has noted some violaceous type bruise under her left breast area I am following up on this.  There is some thought possibly this may be a residual effect when she had her seizure with some incidental trauma-- there's been no history of trauma documented to my knowledge she does not appear to be having any issues with this  Area-- she is not having any pain there is no other bruising apparent she appears to be stable  Her labs after the seizure appear to be unremarkable except TSH was mildly elevated at 6.046-she is on Synthroid 75 g a day-with a previous history of hypothyroidism   Past medical history.  -History of right breast cancer.  Hypothyroidism.  History CVA right middle cerebral artery-territory.  Failure to thrive.  Past surgical history.  Right breast  reconstruction.  Eyes surgery.    Medications have been reviewed they are fairly minimal actually including aspirin 325 mg a day-atorvastatin 10 mg day-potassium 10 mEq a day-Remeron 7.5 mg daily at bedtime-and Synthroid 50 g daily-she is on Magic cup supplementation 3 times a day also on Tylenol 650 mg when necessary Keppra 500 mg twice a day has been started as well  .  Marland Kitchen  Review of systems   Limited secondary to dementia-per nursing staff is stable again they have noted some bruising to her left thorax area patient is not complaining of any pain or shortness of breath     Physical exam.   She is afebrile pulse of 80 respirations 18--recent blood pressures 126/84-102/58--weight recently appears to be stable around 97 pounds    In general this is a frail elderly female resting comfortably in bed she appears to be at her baseline  Her skin is warm and dry--I do note on left thorax area there is an area of violaceous-brawny-type bruising this appears to be old there is no elevation or significant warmth or tenderness to this area  Pupils appear to be reactive to light.  Pharynx clear mucous membranes moist   Chest is clear to auscultation- Heart is regular rate and rhythm with 2/6 systoloic murmur murmur she does not have significant lower extremity edema.  Abdomen is soft nontender positive bowel sounds   .  Muscle skeletal she continues with left-sided hemiparalysis-  . Neurologic as noted above_ Other than somulence  do not really note any difference from baseline-   Psych she is grossly conversant although there are periods of confusion. She continues to be pleasantly confused  Labs  05/03/2015.  WBC 7.8 hemoglobin 11.5 platelets 223.  Sodium 138 potassium 3.8 BUN 29 creatinine 0.63.  Liver function tests within normal limits.  TSH-6.046  03/16/2015.  Sodium 138 potassium 4 BUN 23 creatinine 0.64.  WBC 8.7 hemoglobin 11.2 platelets  278.  02/25/2015.  BUN 3.5-liver function tests within normal limits  01/28/2015.  TSH-2.697.  Sodium 141 potassium 4.1 BUN 13 creatinine 0.51.  WBC 4.4 hemoglobin 12.6 platelets 205.    12/20/2014.  Sodium 138 potassium 3.8 BUN 13 creatinine 0.52  12/14/2014.  Sodium 136 potassium 3.9 BUN 19 creatinine 0.57.  Liver function tests within normal limits except albumin noted to be 3.3.  WBC 4.9 hemoglobin 10.2 platelets 249.  12/03/2014.  WBC 5.9 hemoglobin 10.5 platelets 275.  Sodium 136 potassium 4.1 BUN 21 creatinine 0.54.  Albumin 3.5 liver function tests within normal limits  11/25/2014.  Sodium 138 potassium 3.9 BUN 19 creatinine 0.54.  WBC 4.7 hemoglobin 10.4 platelets 241-albumen 3.6.  Assessment and plan.  History of seizure disorder new onset-as noted above there is been no reoccurrence-she is known Or 500 mg twice a day-at this point will monitor but she appears to be back at her baseline and actually has been this way since shortly after her seizure Lab work other than mildly elevated TSH was within normal limits     #2 history of extensive right middle cerebral artery stroke-continues with left hemiparalysis and left homonymous hemianopsia---suspect at some point this has put her at risk for seizures at this point again will need close monitoring but she appears to be back at her baseline   #3 history of hematoma left thorax area-one wonder possibly if she may have inadvertently sustain this during her seizure-nonetheless this does not appear to be painful infected or acutely concerning-at this point continue to monitor this appears to be resolving.  #4 history hypothyroidism TSH is elevated this was essentially an incidental finding but will increase her Synthroid up to 100 g a day and recheck this in 4 weeks.  #5 failure thrive-she is on Remeron as well as dietary supplementation her weight appears to be relatively stable-lab work actually is quite  unremarkable her albumin actually is 3.6 and has held steady at this point continue to monitor  [id number]          .    Marland Kitchen

## 2015-05-27 ENCOUNTER — Encounter (HOSPITAL_COMMUNITY)
Admission: RE | Admit: 2015-05-27 | Discharge: 2015-05-27 | Disposition: A | Discharge status: Home / Self Care | Payer: Medicare Other | Source: Skilled Nursing Facility | Attending: Internal Medicine | Admitting: Internal Medicine

## 2015-05-27 DIAGNOSIS — N390 Urinary tract infection, site not specified: Secondary | ICD-10-CM | POA: Diagnosis present

## 2015-05-27 DIAGNOSIS — E079 Disorder of thyroid, unspecified: Secondary | ICD-10-CM | POA: Insufficient documentation

## 2015-05-27 DIAGNOSIS — R3989 Other symptoms and signs involving the genitourinary system: Secondary | ICD-10-CM | POA: Diagnosis not present

## 2015-05-27 DIAGNOSIS — I639 Cerebral infarction, unspecified: Secondary | ICD-10-CM | POA: Diagnosis present

## 2015-05-27 LAB — TSH: TSH: 0.415 u[IU]/mL (ref 0.350–4.500)

## 2015-06-10 ENCOUNTER — Encounter (HOSPITAL_COMMUNITY)
Admission: RE | Admit: 2015-06-10 | Discharge: 2015-06-10 | Disposition: A | Discharge status: Home / Self Care | Payer: Medicare Other | Source: Skilled Nursing Facility | Attending: Internal Medicine | Admitting: Internal Medicine

## 2015-06-10 DIAGNOSIS — R3989 Other symptoms and signs involving the genitourinary system: Secondary | ICD-10-CM | POA: Diagnosis present

## 2015-06-10 DIAGNOSIS — R509 Fever, unspecified: Secondary | ICD-10-CM | POA: Insufficient documentation

## 2015-06-10 LAB — URINALYSIS, ROUTINE W REFLEX MICROSCOPIC
Bilirubin Urine: NEGATIVE
Glucose, UA: NEGATIVE mg/dL
Ketones, ur: NEGATIVE mg/dL
NITRITE: POSITIVE — AB
PH: 5.5 (ref 5.0–8.0)
Protein, ur: 100 mg/dL — AB

## 2015-06-10 LAB — URINE MICROSCOPIC-ADD ON

## 2015-06-11 ENCOUNTER — Encounter (HOSPITAL_COMMUNITY)
Admission: RE | Admit: 2015-06-11 | Discharge: 2015-06-11 | Disposition: A | Discharge status: Home / Self Care | Payer: Medicare Other | Source: Skilled Nursing Facility | Attending: Internal Medicine | Admitting: Internal Medicine

## 2015-06-11 DIAGNOSIS — I639 Cerebral infarction, unspecified: Secondary | ICD-10-CM | POA: Insufficient documentation

## 2015-06-11 DIAGNOSIS — R001 Bradycardia, unspecified: Secondary | ICD-10-CM | POA: Diagnosis not present

## 2015-06-11 LAB — CBC WITH DIFFERENTIAL/PLATELET
BASOS ABS: 0 10*3/uL (ref 0.0–0.1)
BASOS PCT: 0 %
Eosinophils Absolute: 0 10*3/uL (ref 0.0–0.7)
Eosinophils Relative: 0 %
HEMATOCRIT: 35.4 % — AB (ref 36.0–46.0)
HEMOGLOBIN: 11.5 g/dL — AB (ref 12.0–15.0)
Lymphocytes Relative: 18 %
Lymphs Abs: 1.6 10*3/uL (ref 0.7–4.0)
MCH: 31.9 pg (ref 26.0–34.0)
MCHC: 32.5 g/dL (ref 30.0–36.0)
MCV: 98.1 fL (ref 78.0–100.0)
Monocytes Absolute: 0.8 10*3/uL (ref 0.1–1.0)
Monocytes Relative: 9 %
NEUTROS ABS: 6.7 10*3/uL (ref 1.7–7.7)
NEUTROS PCT: 73 %
Platelets: 226 10*3/uL (ref 150–400)
RBC: 3.61 MIL/uL — AB (ref 3.87–5.11)
RDW: 14.4 % (ref 11.5–15.5)
WBC: 9.2 10*3/uL (ref 4.0–10.5)

## 2015-06-11 LAB — BASIC METABOLIC PANEL
ANION GAP: 9 (ref 5–15)
BUN: 35 mg/dL — ABNORMAL HIGH (ref 6–20)
CALCIUM: 9.5 mg/dL (ref 8.9–10.3)
CO2: 27 mmol/L (ref 22–32)
Chloride: 103 mmol/L (ref 101–111)
Creatinine, Ser: 0.64 mg/dL (ref 0.44–1.00)
Glucose, Bld: 104 mg/dL — ABNORMAL HIGH (ref 65–99)
POTASSIUM: 3.6 mmol/L (ref 3.5–5.1)
Sodium: 139 mmol/L (ref 135–145)

## 2015-06-11 LAB — TSH: TSH: 0.37 u[IU]/mL (ref 0.350–4.500)

## 2015-06-12 LAB — URINE CULTURE

## 2015-06-13 ENCOUNTER — Encounter (HOSPITAL_COMMUNITY)
Admission: RE | Admit: 2015-06-13 | Discharge: 2015-06-13 | Disposition: A | Discharge status: Home / Self Care | Payer: Medicare Other | Source: Skilled Nursing Facility | Attending: Internal Medicine | Admitting: Internal Medicine

## 2015-06-13 DIAGNOSIS — R001 Bradycardia, unspecified: Secondary | ICD-10-CM | POA: Diagnosis not present

## 2015-06-13 LAB — URINALYSIS, ROUTINE W REFLEX MICROSCOPIC
Bilirubin Urine: NEGATIVE
GLUCOSE, UA: NEGATIVE mg/dL
KETONES UR: NEGATIVE mg/dL
Nitrite: POSITIVE — AB
Specific Gravity, Urine: 1.025 (ref 1.005–1.030)
pH: 6.5 (ref 5.0–8.0)

## 2015-06-13 LAB — URINE MICROSCOPIC-ADD ON: Squamous Epithelial / LPF: NONE SEEN

## 2015-06-15 LAB — URINE CULTURE

## 2015-06-21 ENCOUNTER — Other Ambulatory Visit (HOSPITAL_COMMUNITY)
Admission: AD | Admit: 2015-06-21 | Discharge: 2015-06-21 | Disposition: A | Discharge status: Home / Self Care | Payer: Medicare Other | Source: Skilled Nursing Facility | Attending: Internal Medicine | Admitting: Internal Medicine

## 2015-06-21 DIAGNOSIS — R3989 Other symptoms and signs involving the genitourinary system: Secondary | ICD-10-CM | POA: Diagnosis present

## 2015-06-21 LAB — URINALYSIS, ROUTINE W REFLEX MICROSCOPIC
Bilirubin Urine: NEGATIVE
Glucose, UA: NEGATIVE mg/dL
Ketones, ur: NEGATIVE mg/dL
Nitrite: POSITIVE — AB
SPECIFIC GRAVITY, URINE: 1.02 (ref 1.005–1.030)
pH: 7 (ref 5.0–8.0)

## 2015-06-21 LAB — URINE MICROSCOPIC-ADD ON

## 2015-06-22 ENCOUNTER — Encounter: Payer: Self-pay | Admitting: Internal Medicine

## 2015-06-22 ENCOUNTER — Non-Acute Institutional Stay (SKILLED_NURSING_FACILITY): Payer: Medicare Other | Admitting: Internal Medicine

## 2015-06-22 ENCOUNTER — Ambulatory Visit (HOSPITAL_COMMUNITY)
Admit: 2015-06-22 | Discharge: 2015-06-22 | Disposition: A | Discharge status: Home / Self Care | Payer: Medicare Other | Attending: Internal Medicine | Admitting: Internal Medicine

## 2015-06-22 DIAGNOSIS — R059 Cough, unspecified: Secondary | ICD-10-CM | POA: Insufficient documentation

## 2015-06-22 DIAGNOSIS — R05 Cough: Secondary | ICD-10-CM | POA: Diagnosis present

## 2015-06-22 DIAGNOSIS — E038 Other specified hypothyroidism: Secondary | ICD-10-CM

## 2015-06-22 DIAGNOSIS — R569 Unspecified convulsions: Secondary | ICD-10-CM

## 2015-06-22 DIAGNOSIS — G8194 Hemiplegia, unspecified affecting left nondominant side: Secondary | ICD-10-CM | POA: Diagnosis not present

## 2015-06-22 NOTE — Progress Notes (Signed)
Patient ID: Tara Welch, female   DOB: [birth year], 82 y.o.   MRN: [id number]                        This is a routine  visit.  Level care skilled.  Facility [facility].  Chief complaint-medical management of chronic medical conditions including history of CVA-hypothyroidism-seizure disorder-failure to thrive  History of present illness.  Patient is a pleasant elderly female who had a large nondominant hemispheric CVA involving the temporal frontal and parietal lobes-she has left hemiparalysis and left neglect this has improved somewhat  Initially she has significant weight loss but this appears to have stabilized for time but now again appears to be trending down.  She is on Remeron I suspect progression of her vascular dementia may be contributing to this - .Recently she apeared to have a grand mal seizure with tonic-clonic aspects-by the time I arrived in the room she was in her post seizure state was sleeping-eventually later that afternoon she returned essentially to her baseline with no further seizure activity noted.  At that point we did start her empirically on Keppra 500 mg twice a day per discussion with Dr. Dellia Nims.  Since then she has been seizure-free appears to be at her baseline. Although appears to be slowly declining   She does have a history of hypothyroidism is on Synthroid recent TSH was 0.370.  Nursing staff is somewhat concerned that she may have some slight aspiration here we will order a chest x-ray apparently she is having a cough at times-bowel signs O2 saturations continued to be stable   Past medical history.  -History of right breast cancer.  Hypothyroidism.  History CVA right middle cerebral artery-territory.  Failure to thrive.  Past surgical history.  Right breast reconstruction.  Eyes surgery.    Medications have been reviewed they are fairly minimal actually including aspirin 325 mg a day-atorvastatin 10 mg  day-potassium 20 mEq a day-Remeron 7.5 mg daily at bedtime-and Synthroid 100 g daily-she is on Magic cup supplementation 3 times a day also on Tylenol 650 mg when necessary Keppra 500 mg twice a day has been started as well  .  Marland Kitchen  Review of systems   Limited secondary to dementia- Please see history of present illness     Physical exam.   Temperature 97.4 pulse 63 respirations 16 blood pressure 120/63 weight is 89.8 this appears to be a decline of about 8 pounds over the past several weeks    In general this is a frail elderly female resting comfortably in bed she appears to be at her baseline  Her skin is warm and dry--   Pupils appear to be reactive to light.  Pharynx clear mucous membranes moist   Chest is clear to auscultation-with poor respiratory effort shallow air entry there is no labored breathing  Heart is regular rate and rhythm with 2/6 systoloic murmur murmur she does not have significant lower extremity edema.  Abdomen is soft nontender positive bowel sounds   .  Muscle skeletal she continues with left-sided hemiparalysis-  . Neurologic as noted above_continues with left-sided hemi-paralysis     Psych she is grossly conversant although there are periods of confusion. She continues to be pleasantly confused does not appear quite as energetic as my last exam  Labs  06/11/2015.  Sodium 139 potassium 3.6 BUN 35 creatinine 0.64.  WBC 9.2 hemoglobin 11.5 platelets 226.  TSH-0.370 05/03/2015.  WBC 7.8 hemoglobin 11.5  platelets 223.  Sodium 138 potassium 3.8 BUN 29 creatinine 0.63.  Liver function tests within normal limits.  TSH-6.046  03/16/2015.  Sodium 138 potassium 4 BUN 23 creatinine 0.64.  WBC 8.7 hemoglobin 11.2 platelets 278.  02/25/2015.  BUN 3.5-liver function tests within normal limits  01/28/2015.  TSH-2.697.  Sodium 141 potassium 4.1 BUN 13 creatinine 0.51.  WBC 4.4 hemoglobin 12.6 platelets  205.    12/20/2014.  Sodium 138 potassium 3.8 BUN 13 creatinine 0.52  12/14/2014.  Sodium 136 potassium 3.9 BUN 19 creatinine 0.57.  Liver function tests within normal limits except albumin noted to be 3.3.  WBC 4.9 hemoglobin 10.2 platelets 249.  12/03/2014.  WBC 5.9 hemoglobin 10.5 platelets 275.  Sodium 136 potassium 4.1 BUN 21 creatinine 0.54.  Albumin 3.5 liver function tests within normal limits  11/25/2014.  Sodium 138 potassium 3.9 BUN 19 creatinine 0.54.  WBC 4.7 hemoglobin 10.4 platelets 241-albumen 3.6.  Assessment and plan.  History of seizure disorder new onset-as noted above there is been no reoccurrence-she is on Keppra  500 mg twice a day-at this point will monitor but she appears to be back at her baseline and actually has been this way since shortly after her seizure      #2 history of extensive right middle cerebral artery stroke-continues with left hemiparalysis and left homonymous hemianopsia---suspect at some point this has put her at risk for seizures at this point again will need close monitoring but she appears to be back at her baseline   #3 hypothyroidism TSH was mildly elevated Synthroid was increased is is now 0.370--will warrant updating in a couple weeks .  #4 failure thrive-she is on Remeron as well as dietary supplementation Her weight is gradually declining-this is not totally unexpected-with history of CVA in resulting dementia-she does have a most form--her responsible party does not desire aggressive intervention i.e. feeding tube  #5-history of cough-lung exam was fairly benign but she is at risk for aspiration will update a chest x-ray  [id number]          .    Marland Kitchen

## 2015-06-23 LAB — URINE CULTURE: Culture: >=100000

## 2015-07-07 ENCOUNTER — Encounter (HOSPITAL_COMMUNITY)
Admission: AD | Admit: 2015-07-07 | Discharge: 2015-07-07 | Disposition: A | Discharge status: Home / Self Care | Payer: Medicare Other | Source: Skilled Nursing Facility | Attending: Internal Medicine | Admitting: Internal Medicine

## 2015-07-07 DIAGNOSIS — I639 Cerebral infarction, unspecified: Secondary | ICD-10-CM | POA: Diagnosis present

## 2015-07-07 LAB — LIPID PANEL
CHOL/HDL RATIO: 1.8 ratio
CHOLESTEROL: 76 mg/dL (ref 0–200)
HDL: 42 mg/dL (ref >40)
LDL Cholesterol: 23 mg/dL (ref 0–99)
Triglycerides: 55 mg/dL (ref <150)
VLDL: 11 mg/dL (ref 0–40)

## 2015-07-11 ENCOUNTER — Other Ambulatory Visit: Payer: Self-pay | Admitting: *Deleted

## 2015-07-11 ENCOUNTER — Encounter (HOSPITAL_COMMUNITY)
Admission: RE | Admit: 2015-07-11 | Discharge: 2015-07-11 | Disposition: A | Discharge status: Home / Self Care | Payer: Medicare Other | Source: Skilled Nursing Facility | Attending: Internal Medicine | Admitting: Internal Medicine

## 2015-07-11 DIAGNOSIS — I639 Cerebral infarction, unspecified: Secondary | ICD-10-CM | POA: Diagnosis not present

## 2015-07-11 LAB — TSH: TSH: 0.04 u[IU]/mL — ABNORMAL LOW (ref 0.350–4.500)

## 2015-07-11 MED ORDER — MORPHINE SULFATE (CONCENTRATE) 20 MG/ML PO SOLN: ORAL | Status: AC

## 2015-07-11 NOTE — Telephone Encounter (Signed)
[facility] 

## 2015-07-15 ENCOUNTER — Non-Acute Institutional Stay (SKILLED_NURSING_FACILITY): Payer: Medicare Other | Admitting: Internal Medicine

## 2015-07-15 DIAGNOSIS — R627 Adult failure to thrive: Secondary | ICD-10-CM | POA: Diagnosis not present

## 2015-07-15 DIAGNOSIS — I6990 Unspecified sequelae of unspecified cerebrovascular disease: Secondary | ICD-10-CM

## 2015-07-15 NOTE — Progress Notes (Signed)
Patient ID: Tara Welch, female   DOB: 07/15/1921, 82 y.o.   MRN: [id number]                         This is an acute  visit.  Level care skilled.  Facility [facility].  Chief complaint- Acute visit failure to thrive  History of present illness.  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  She is not really eating and drinking well-she was started on Remeron but this does not really appear to be helping.  Her responsible party has filled out an MOST  form-she does not want any aggressive intervention-this was reiterated with nursing staff earlier this week and her medications have been minimized.  She did have some history of possibly seizure activity several weeks ago and was started on Keppra but this has been discontinued in an attempt to minimize her medication she also has had her Synthroid discontinued she does have a history of hypothyroidism.  She essentially is now on morphine 5 mg every 4 hours when necessary to help with comfort.  Nursing staff feels she is somewhat anxious however and would benefit from addition of when necessary Ativan as well.  Currently patient has no acute complaints she is complaining of some leg pain however this appeared to have been alleviated somewhat when nursing was able to elevate her legs somewhat.  She continues to eat and drink very poorly.      Past medical history.  -History of right breast cancer.  Hypothyroidism.  History CVA right middle cerebral artery-territory.  Failure to thrive.  Past surgical history.  Right breast reconstruction.  Eyes surgery.    Medications have been reviewed they are fairly minimal    Essentially Roxanol 5 mg every 4 hours when necessary  .  Marland Kitchen  Review of systems   Limited secondary to dementia- Please see history of present illness     Physical exam.   She is afebrile pulse of 72 respirations of 19    In general this is an increasingly frail appearing female she does not appear to be in any distress continues to be pleasant smiling somewhat confused-she appears to be declining physically   Her skin is warm and dry--   Pupils appear to be reactive to light.  Pharynx clear mucous membranes somewhat dry   Chest is clear to auscultation-with poor respiratory effort shallow air entry there is no labored breathing  Heart is regular rate and rhythm with 2/6 systoloic murmur murmur she does not have significant lower extremity edema.  Abdomen is soft nontender positive bowel sounds   .  Muscle skeletal she continues with left-sided hemiparalysis-and muscle wasting that is fairly diffuse  . Neurologic as noted above_continues with left-sided hemi-paralysis     Psych she is talking somewhat confused but remains pleasant   Labs  06/11/2015.  Sodium 139 potassium 3.6 BUN 35 creatinine 0.64.  WBC 9.2 hemoglobin 11.5 platelets 226.  TSH-0.370 05/03/2015.  WBC 7.8 hemoglobin 11.5 platelets 223.  Sodium 138 potassium 3.8 BUN 29 creatinine 0.63.  Liver function tests within normal limits.  TSH-6.046  03/16/2015.  Sodium 138 potassium 4 BUN 23 creatinine 0.64.  WBC 8.7 hemoglobin 11.2  platelets 278.  02/25/2015.  BUN 3.5-liver function tests within normal limits  01/28/2015.  TSH-2.697.  Sodium 141 potassium 4.1 BUN 13 creatinine 0.51.  WBC 4.4 hemoglobin 12.6 platelets 205.         Assessment and plan.   Failure to thrive-this appears to be progressing.  Remeron did not appear to really help at this point-nursing staff has tried various supplements as well but this has been a challenge.  Her responsible party  does not want any aggressive interventions here PEG tube IV fluids etc.-emphasis is on comfort and in addition to the Roxanol will start the Ativan solution 0.5 mg every 4 hours when necessary for anxiety I suspect this could be more of an issue as this progresses and would like to have it on board.  Regards to seizure disorder and hypothyroidism-her Keppra as well as her Synthroid have been discontinued secondary to attempts to minimize medications here-nonetheless there are been no recent seizures.  This will have to be monitored but at this point patient appears to be fairly comfortable although extremely frail and declining here we may have to increase the frequency of the Roxanol or Ativan as this progresses but this point appears to be effective  . [id number]                    .    Marland Kitchen

## 2015-07-16 DIAGNOSIS — R627 Adult failure to thrive: Secondary | ICD-10-CM | POA: Insufficient documentation

## 2015-07-16 DIAGNOSIS — I6990 Unspecified sequelae of unspecified cerebrovascular disease: Secondary | ICD-10-CM | POA: Insufficient documentation

## 2015-08-09 ENCOUNTER — Encounter (HOSPITAL_COMMUNITY): Payer: Self-pay

## 2015-08-13 DEATH — deceased

## 2017-04-30 IMAGING — MR MR HEAD W/O CM
8 of 10 series · 35 of 48 positions shown · non-contrast
Comparison: Head CT without contrast 10/11/2014.

ADDENDUM:
Study discussed by telephone with Dr. KARAOKENIGHTS RIJEKA LEONARDA on 10/13/2014 at
5625 hrs.
CLINICAL DATA: [AGE] female with altered mental status, not
responding. Stroke symptoms on 10/11/2014. Initial encounter.

EXAM:
MRI HEAD WITHOUT CONTRAST
TECHNIQUE: Multiplanar, multiecho pulse sequences of the brain and surrounding
structures were obtained without intravenous contrast.

[Series 3: [id number] · sagittal · 5.0mm · 0.45mm/px · 1 of 20 slices shown]
[im 1/20]
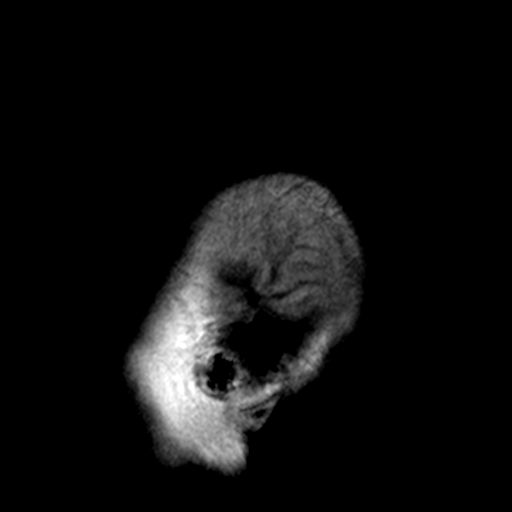

[Series 6: T2 · axial · 5.0mm · 0.75mm/px · z∈[-103,+40]mm · 3 of 23 slices shown (1 of 2)]
[im 1/23]
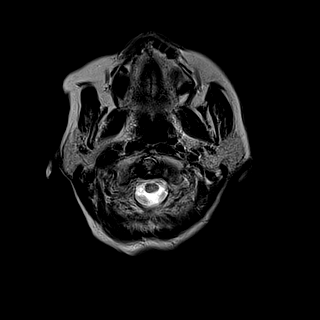
[im 12/23]
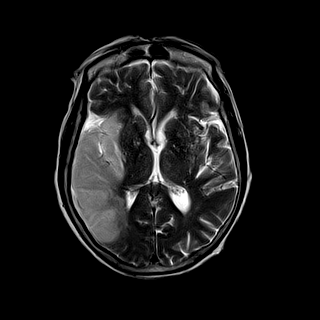
[im 23/23]
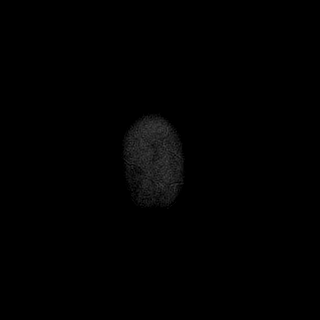

[Series 7: FLAIR · axial · 5.0mm · 0.82mm/px · z∈[-102,+40]mm · 3 of 23 slices shown]
[im 1/23]
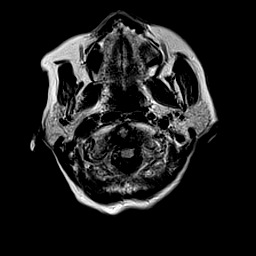
[im 12/23]
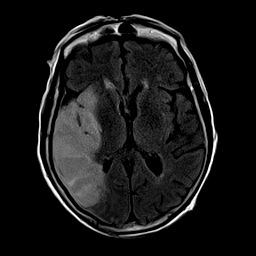
[im 23/23]
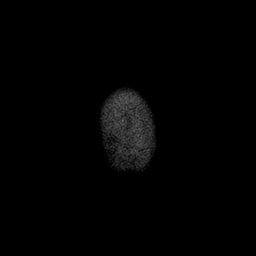

[Series 8: T1 · axial · 2.0mm · 0.47mm/px · z∈[-114,+41]mm · 9 of 79 slices shown]
[im 1/79]
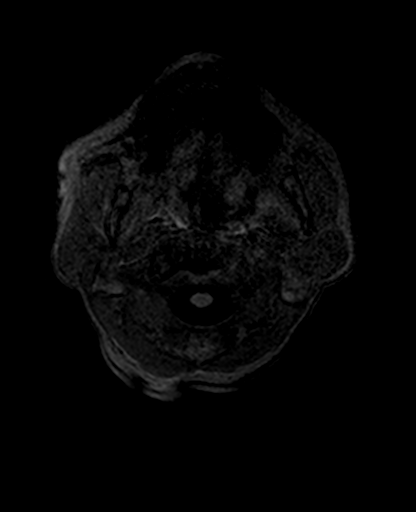
[im 10/79]
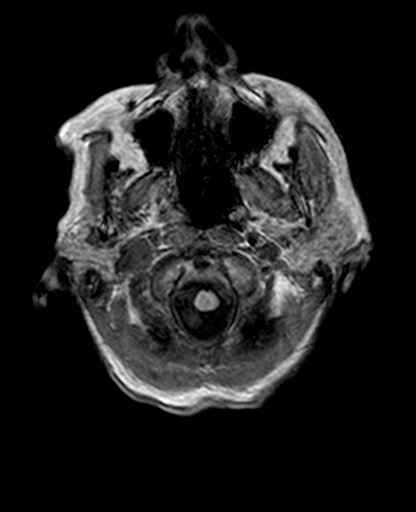
[im 20/79]
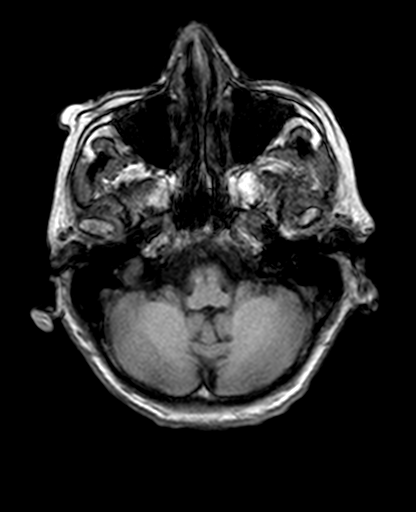
[im 30/79]
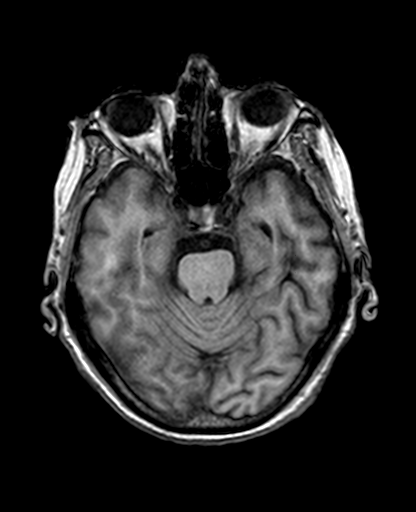
[im 40/79]
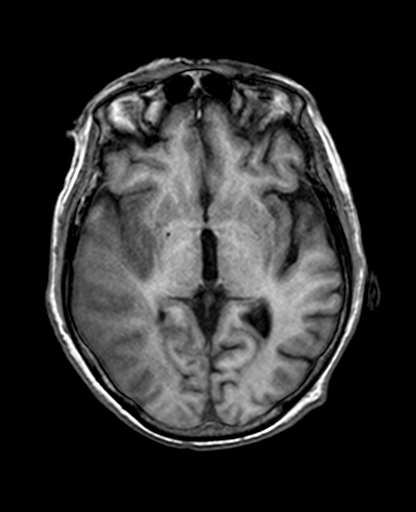
[im 49/79]
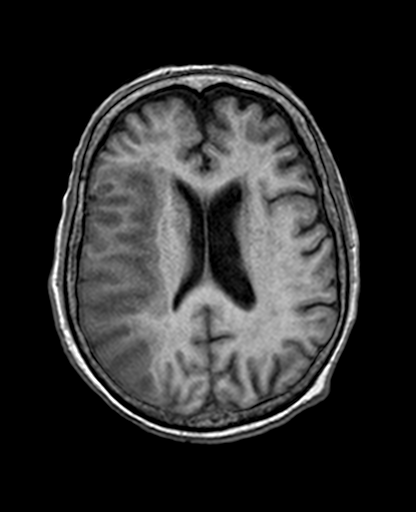
[im 59/79]
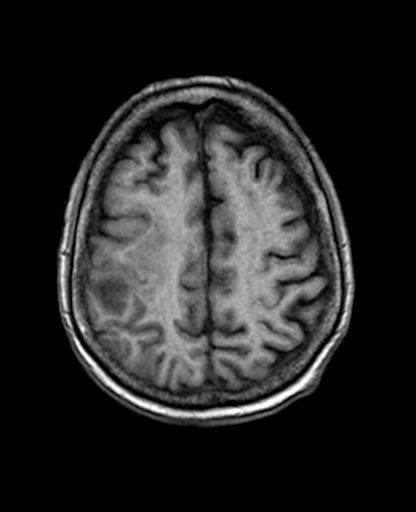
[im 69/79]
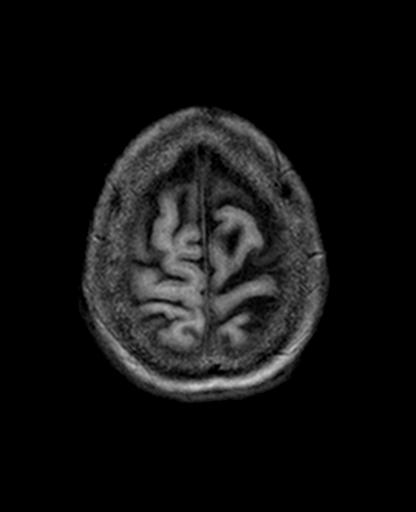
[im 79/79]
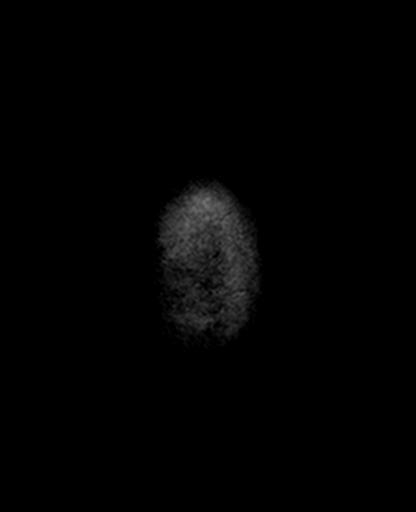

[Series 9: trauma axial · axial · 5.0mm · 0.45mm/px · z∈[-101,+41]mm · 3 of 23 slices shown]
[im 1/23]
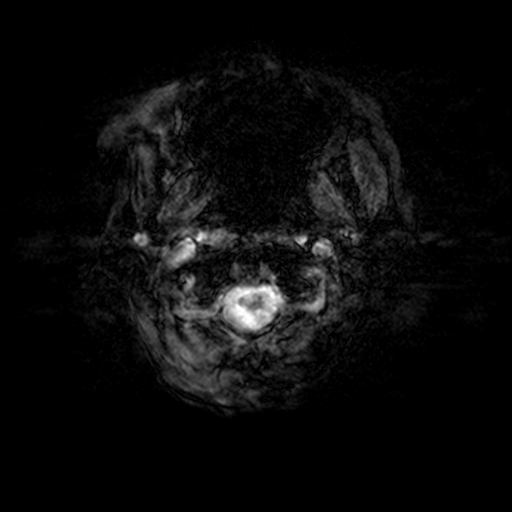
[im 12/23]
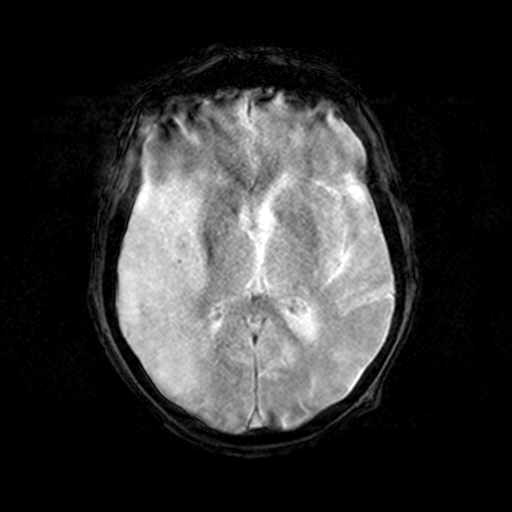
[im 23/23]
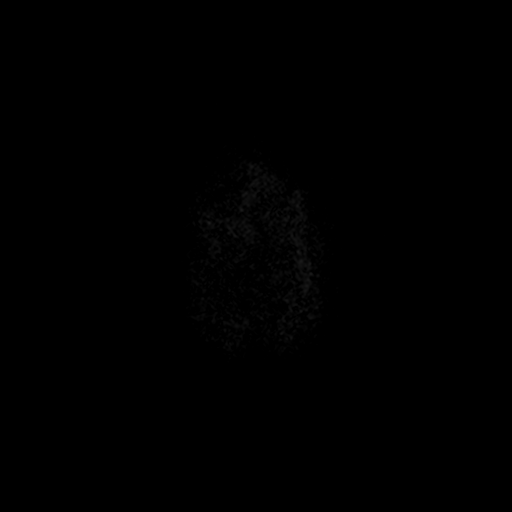

[Series 10: T2 · coronal · 5.0mm · 0.62mm/px · 3 of 28 slices shown (2 of 2)]
[im 1/28]
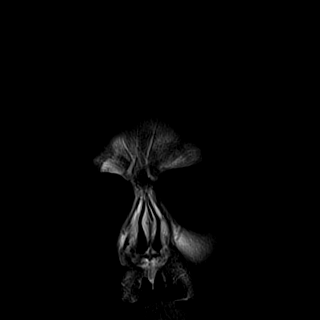
[im 14/28]
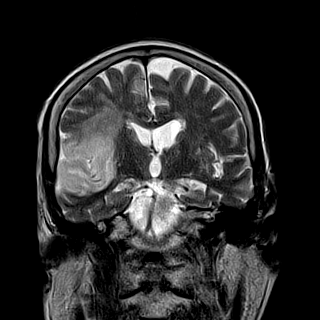
[im 28/28]
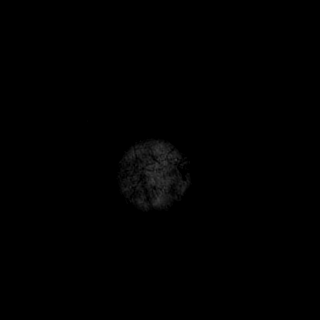

[Series 100: <mpr thick range> · axial · 3.0mm · 0.73mm/px · z∈[-103,+35]mm · 6 of 47 slices shown]
[im 1/47]
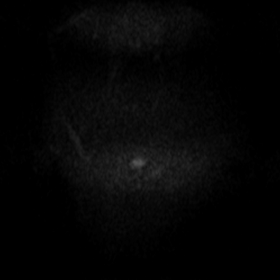
[im 10/47]
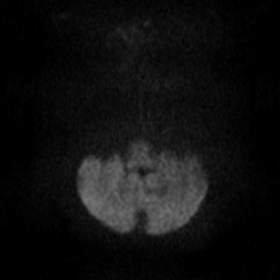
[im 19/47]
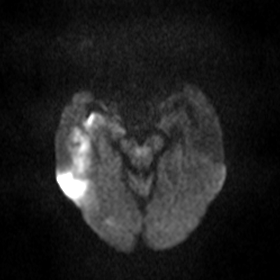
[im 28/47]
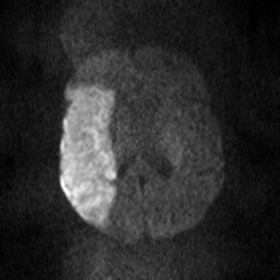
[im 37/47]
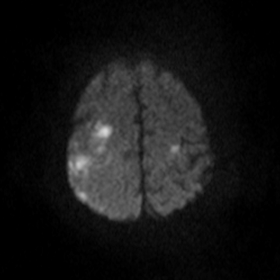
[im 47/47]
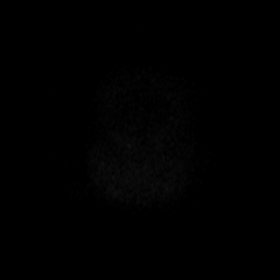

[Series 101: <mpr thick range(1)> · coronal · 3.0mm · 0.66mm/px · 7 of 58 slices shown]
[im 1/58]
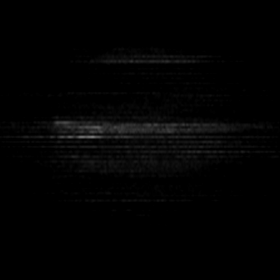
[im 10/58]
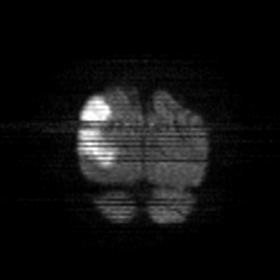
[im 20/58]
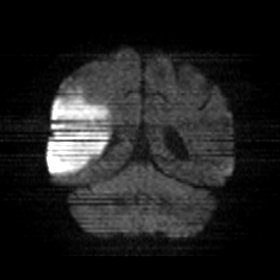
[im 29/58]
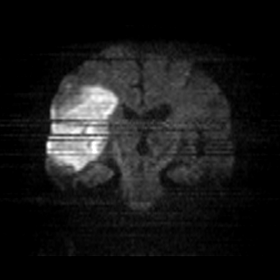
[im 39/58]
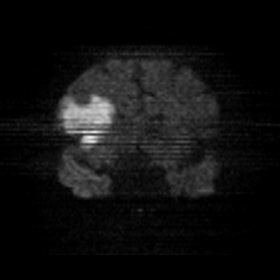
[im 48/58]
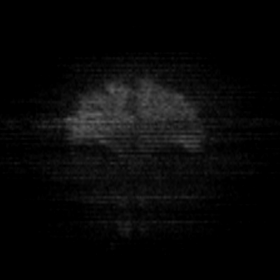
[im 58/58]
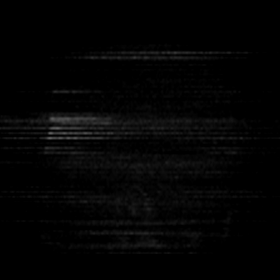

[35 of 48 positions shown; findings below may reference images not displayed]

FINDINGS: Large area of intensely restricted diffusion involving the majority
of the right MCA territory. The right basal ganglia are relatively
spared. Confluent cytotoxic edema with T2 and FLAIR hyperintensity.
No hemorrhagic transformation identified. Mass effect on the right
lateral ventricle without midline shift or ventriculomegaly.

Small 5-6 mm focus of restricted diffusion in the left superior
frontal gyrus (series 100, image 37). No other contralateral
restricted diffusion, and no posterior fossa restriction.

Major intracranial vascular flow voids are preserved.

Basilar cisterns are patent. No acute intracranial hemorrhage
identified. Superimposed on the acute findings, mild to moderate for
age patchy in scattered nonspecific cerebral white matter T2 and
FLAIR hyperintensity. Deep gray matter nuclei, brainstem and
cerebellum are within normal limits for age. Visible internal
auditory structures appear normal. Visualized paranasal sinuses and
mastoids are clear. Postoperative changes to the globes. Negative
pituitary, cervicomedullary junction and visualized cervical spine.
Normal bone marrow signal. Visualized scalp soft tissues are within
normal limits.
IMPRESSION: 1. Large right MCA acute infarct with cytotoxic edema but no
hemorrhage. Relatively mild mass effect at this time, with partial
effacement of the right lateral ventricle but no midline shift. No
ventriculomegaly.
2. Noncontrast head CT can be used for surveillance of intracranial
mass effect.
3. Small lacunar type infarct also in the left superior frontal
gyrus, favor synchronous small vessel disease.
